# Patient Record
Sex: Female | Born: 1975 | Race: Black or African American | Hispanic: No | Marital: Single | Smoking: Current some day smoker
Health system: Southern US, Community
[De-identification: ages and names within clinical notes are randomized; demographics above are authoritative.]

## PROBLEM LIST (undated history)

## (undated) HISTORY — PX: DILATION AND CURETTAGE, DIAGNOSTIC / THERAPEUTIC: SUR384

## (undated) HISTORY — PX: CHOLECYSTECTOMY: SHX55

## (undated) HISTORY — PX: BREAST LUMPECTOMY: SHX2

## (undated) HISTORY — DX: Morbid (severe) obesity due to excess calories: E66.01

---

## 1998-09-13 ENCOUNTER — Inpatient Hospital Stay (HOSPITAL_COMMUNITY): Admission: AD | Admit: 1998-09-13 | Discharge: 1998-09-13 | Payer: Self-pay | Admitting: Obstetrics & Gynecology

## 2014-11-28 DIAGNOSIS — N939 Abnormal uterine and vaginal bleeding, unspecified: Secondary | ICD-10-CM

## 2014-11-28 HISTORY — DX: Abnormal uterine and vaginal bleeding, unspecified: N93.9

## 2017-09-06 DIAGNOSIS — N6312 Unspecified lump in the right breast, upper inner quadrant: Secondary | ICD-10-CM

## 2017-09-06 HISTORY — DX: Unspecified lump in the right breast, upper inner quadrant: N63.12

## 2019-01-03 ENCOUNTER — Other Ambulatory Visit: Payer: Self-pay | Admitting: *Deleted

## 2019-01-03 DIAGNOSIS — Z20822 Contact with and (suspected) exposure to covid-19: Secondary | ICD-10-CM

## 2019-01-08 LAB — NOVEL CORONAVIRUS, NAA: SARS-CoV-2, NAA: NOT DETECTED

## 2019-10-02 ENCOUNTER — Ambulatory Visit (HOSPITAL_COMMUNITY)
Admission: EM | Admit: 2019-10-02 | Discharge: 2019-10-02 | Disposition: A | Discharge status: Home / Self Care | Payer: Medicaid Other | Attending: Urgent Care | Admitting: Urgent Care

## 2019-10-02 ENCOUNTER — Encounter (HOSPITAL_COMMUNITY): Payer: Self-pay

## 2019-10-02 ENCOUNTER — Other Ambulatory Visit: Payer: Self-pay

## 2019-10-02 DIAGNOSIS — B349 Viral infection, unspecified: Secondary | ICD-10-CM | POA: Diagnosis present

## 2019-10-02 DIAGNOSIS — R509 Fever, unspecified: Secondary | ICD-10-CM | POA: Diagnosis present

## 2019-10-02 DIAGNOSIS — J029 Acute pharyngitis, unspecified: Secondary | ICD-10-CM | POA: Diagnosis present

## 2019-10-02 LAB — POCT RAPID STREP A: Streptococcus, Group A Screen (Direct): NEGATIVE

## 2019-10-02 MED ORDER — NAPROXEN 125 MG/5ML PO SUSP: 250.0000 mg | Freq: Three times a day (TID) | ORAL | 0 refills | Status: DC

## 2019-10-02 NOTE — Discharge Instructions (Signed)
For sore throat try using a honey-based tea. Use 3 teaspoons of honey with juice squeezed from half lemon. Place shaved pieces of ginger into 1/2-1 cup of water and warm over stove top. Then mix the ingredients and repeat every 4 hours as needed.  You may take 500mg -650mg  Tylenol with liquid naproxen every 6 hours for aches, pains, fevers and general inflammation.

## 2019-10-02 NOTE — ED Provider Notes (Signed)
[place]   MRN: [id number] DOB: 1975/09/13  Subjective:   Desiree Butler is a 44 y.o. female presenting for 3-day history acute onset persistent throat pain. Had fever last night, temp was 100.20F.  Had Covid vaccination, second dose is next week.  Very little concern for COVID-19 per patient.  No current facility-administered medications for this encounter. No current outpatient medications on file.   No Known Allergies  History reviewed. No pertinent past medical history.   Past Surgical History:  Procedure Laterality Date  . BREAST LUMPECTOMY Right   . CHOLECYSTECTOMY    . DILATION AND CURETTAGE, DIAGNOSTIC / THERAPEUTIC      No family history on file.  Social History   Tobacco Use  . Smoking status: Former Research scientist (life sciences)  . Smokeless tobacco: Never Used  Substance Use Topics  . Alcohol use: Yes    Comment: occasionally  . Drug use: Not on file    Review of Systems  Constitutional: Negative for fever and malaise/fatigue.  HENT: Positive for sore throat. Negative for congestion, ear pain and sinus pain.   Eyes: Negative for discharge and redness.  Respiratory: Negative for cough, hemoptysis, shortness of breath and wheezing.   Cardiovascular: Negative for chest pain.  Gastrointestinal: Negative for abdominal pain, diarrhea, nausea and vomiting.  Genitourinary: Negative for dysuria, flank pain and hematuria.  Musculoskeletal: Negative for myalgias.  Skin: Negative for rash.  Neurological: Negative for dizziness, weakness and headaches.  Psychiatric/Behavioral: Negative for depression and substance abuse.     Objective:   Vitals: BP 134/72 (BP Location: Left Arm)   Pulse (!) 106   Temp 98.6 F (37 C) (Oral)   Resp 18   LMP 09/30/2019 (Exact Date)   SpO2 100%   Physical Exam Constitutional:      General: She is not in acute distress.    Appearance: Normal appearance. She is well-developed. She is not ill-appearing.  HENT:     Head: Normocephalic  and atraumatic.     Nose: Nose normal.     Mouth/Throat:     Mouth: Mucous membranes are moist. No oral lesions.     Pharynx: Pharyngeal swelling (Appears to have chronic tonsillar hypertrophy bilaterally) present. No oropharyngeal exudate, posterior oropharyngeal erythema or uvula swelling.     Tonsils: No tonsillar exudate or tonsillar abscesses.  Eyes:     General: No scleral icterus.    Extraocular Movements: Extraocular movements intact.     Pupils: Pupils are equal, round, and reactive to light.  Cardiovascular:     Rate and Rhythm: Normal rate.  Pulmonary:     Effort: Pulmonary effort is normal.  Skin:    General: Skin is warm and dry.  Neurological:     General: No focal deficit present.     Mental Status: She is alert and oriented to person, place, and time.  Psychiatric:        Mood and Affect: Mood normal.        Behavior: Behavior normal.     Results for orders placed or performed during the hospital encounter of 10/02/19 (from the past 24 hour(s))  POCT rapid strep A [facility])     Status: None   Collection Time: 10/02/19  7:45 PM  Result Value Ref Range   Streptococcus, Group A Screen (Direct) NEGATIVE NEGATIVE    Assessment and Plan :   1. Viral syndrome   2. Sore throat   3. Fever, unspecified     Strep culture pending, use liquid  naproxen and Tylenol for supportive care.  General supportive care information provided to the patient.  She refused COVID-19 testing today.  Will consider this if no improvement. Counseled patient on potential for adverse effects with medications prescribed/recommended today, ER and return-to-clinic precautions discussed, patient verbalized understanding.    Jaynee Eagles, PA-C 10/02/19 2004

## 2019-10-02 NOTE — ED Triage Notes (Signed)
C/o sore throat x 2 days.

## 2019-10-05 LAB — CULTURE, GROUP A STREP (THRC)

## 2019-10-23 ENCOUNTER — Encounter (HOSPITAL_COMMUNITY): Payer: Self-pay | Admitting: Emergency Medicine

## 2019-10-23 ENCOUNTER — Emergency Department (HOSPITAL_COMMUNITY)
Admission: EM | Admit: 2019-10-23 | Discharge: 2019-10-24 | Disposition: A | Discharge status: Home / Self Care | Payer: Medicaid Other | Attending: Emergency Medicine | Admitting: Emergency Medicine

## 2019-10-23 ENCOUNTER — Other Ambulatory Visit: Payer: Self-pay

## 2019-10-23 DIAGNOSIS — Z5321 Procedure and treatment not carried out due to patient leaving prior to being seen by health care provider: Secondary | ICD-10-CM | POA: Diagnosis not present

## 2019-10-23 DIAGNOSIS — R109 Unspecified abdominal pain: Secondary | ICD-10-CM | POA: Insufficient documentation

## 2019-10-23 LAB — COMPREHENSIVE METABOLIC PANEL
ALT: 21 U/L (ref 0–44)
AST: 21 U/L (ref 15–41)
Albumin: 3.9 g/dL (ref 3.5–5.0)
Alkaline Phosphatase: 43 U/L (ref 38–126)
Anion gap: 8 (ref 5–15)
BUN: 14 mg/dL (ref 6–20)
CO2: 23 mmol/L (ref 22–32)
Calcium: 8.8 mg/dL — ABNORMAL LOW (ref 8.9–10.3)
Chloride: 109 mmol/L (ref 98–111)
Creatinine, Ser: 0.9 mg/dL (ref 0.44–1.00)
GFR calc Af Amer: >60 mL/min (ref >60)
GFR calc non Af Amer: >60 mL/min (ref >60)
Glucose, Bld: 102 mg/dL — ABNORMAL HIGH (ref 70–99)
Potassium: 3.7 mmol/L (ref 3.5–5.1)
Sodium: 140 mmol/L (ref 135–145)
Total Bilirubin: 0.8 mg/dL (ref 0.3–1.2)
Total Protein: 6.7 g/dL (ref 6.5–8.1)

## 2019-10-23 LAB — CBC
HCT: 38.3 % (ref 36.0–46.0)
Hemoglobin: 12.3 g/dL (ref 12.0–15.0)
MCH: 30.3 pg (ref 26.0–34.0)
MCHC: 32.1 g/dL (ref 30.0–36.0)
MCV: 94.3 fL (ref 80.0–100.0)
Platelets: 219 10*3/uL (ref 150–400)
RBC: 4.06 MIL/uL (ref 3.87–5.11)
RDW: 12.8 % (ref 11.5–15.5)
WBC: 6.3 10*3/uL (ref 4.0–10.5)
nRBC: 0 % (ref 0.0–0.2)

## 2019-10-23 LAB — I-STAT BETA HCG BLOOD, ED (MC, WL, AP ONLY): I-stat hCG, quantitative: <5 m[IU]/mL (ref <5)

## 2019-10-23 LAB — LIPASE, BLOOD: Lipase: 25 U/L (ref 11–51)

## 2019-10-23 NOTE — ED Notes (Signed)
Karaline Wiebke sister [phone] would like a call when patient is in a room

## 2019-10-23 NOTE — ED Notes (Signed)
Pt came to this tech requesting to remove her IV because she wanted to leave. Pt was encouraged to stay. Pt decided to leave. IV was removed.

## 2019-10-23 NOTE — ED Triage Notes (Signed)
Pt arrives via EMS with lower abd pain starting today. Hx of fibroids. 100 mcg fentanyl given by EMS.

## 2019-10-24 ENCOUNTER — Encounter (HOSPITAL_COMMUNITY): Payer: Self-pay

## 2019-10-24 ENCOUNTER — Other Ambulatory Visit: Payer: Self-pay

## 2019-10-24 ENCOUNTER — Ambulatory Visit (HOSPITAL_COMMUNITY)
Admission: EM | Admit: 2019-10-24 | Discharge: 2019-10-24 | Disposition: A | Discharge status: Home / Self Care | Payer: Medicaid Other | Attending: Family Medicine | Admitting: Family Medicine

## 2019-10-24 DIAGNOSIS — R1030 Lower abdominal pain, unspecified: Secondary | ICD-10-CM

## 2019-10-24 MED ORDER — DICLOFENAC SODIUM 75 MG PO TBEC
75.0000 mg | DELAYED_RELEASE_TABLET | Freq: Two times a day (BID) | ORAL | 0 refills | Status: DC
Start: 2019-10-24 — End: 2019-12-13

## 2019-10-24 NOTE — ED Triage Notes (Signed)
Pt is here with abdominal paint hat started yesterday, pt has not taken any meds to relieve discomfort. States she went to the ED and then left.

## 2019-10-24 NOTE — Discharge Instructions (Signed)

## 2019-10-24 NOTE — ED Provider Notes (Signed)
[place]   [id number] 10/24/19 Arrival Time: [id number]  ASSESSMENT & PLAN:  1. Lower abdominal pain     Overall benign abdominal exam. No indications for urgent abdominal/pelvic imaging at this time. Discussed. Labs reviewed from ED yesterday.  Recommend: Follow-up Information    Schedule an appointment as soon as possible for a visit  with [facility].   Specialty: Obstetrics and Gynecology Contact information: [address], [place] [id number] mc [place] [phone] [place].   Specialty: Emergency Medicine Why: If symptoms worsen in any way. Contact information: [address] [id number] [place] [phone]          Begin: Meds ordered this encounter  Medications  . diclofenac (VOLTAREN) 75 MG EC tablet    Sig: Take 1 tablet (75 mg total) by mouth 2 (two) times daily.    Dispense:  14 tablet    Refill:  0     Discharge Instructions     You have been seen today for abdominal pain. Your evaluation was not suggestive of any emergent condition requiring medical intervention at this time. However, some abdominal problems make take more time to appear. Therefore, it is very important for you to pay attention to any new symptoms or worsening of your current condition.  Please return here or to the Emergency Department immediately should you begin to feel worse in any way or have any of the following symptoms: increasing or different abdominal pain, persistent vomiting, inability to drink fluids, fevers, or shaking chills.        Reviewed expectations re: course of current medical issues. Questions answered. Outlined signs and symptoms indicating need for more acute intervention. Patient verbalized understanding. After Visit Summary given.   SUBJECTIVE: History from: patient. Desiree Butler is a 44 y.o. female who presents with complaint of fairly persistent midline lower abdominal pain. Onset gradual, yesterday. Went to ED and [facility]BS secondary to wait time. Labs done there. Discomfort described as fullness/ache; without radiation; does not wake her at night. Reports normal flatus. Symptoms are unchanged since beginning. Fever: absent. Aggravating factors: have not been identified. Alleviating factors: have not been identified. Associated symptoms: mild nausea yesterday without emesis; none today. She denies arthralgias, constipation, diarrhea, fever and myalgias. Appetite: normal. PO intake: normal. Ambulatory without assistance. Urinary symptoms: none. Bowel movements: have not significantly changed. History of similar: yes (reports U/S showing fibroids approx 3 mo ago)). OTC treatment: none reported.  Patient's last menstrual period was 09/30/2019 (exact date).   Past Surgical History:  Procedure Laterality Date  . BREAST LUMPECTOMY Right   . CHOLECYSTECTOMY    . DILATION AND CURETTAGE, DIAGNOSTIC / THERAPEUTIC       OBJECTIVE:  Vitals:   10/24/19 1231  BP: (!) 145/84  Pulse: 79  Resp: 19  Temp: 98.2 F (36.8 C)  TempSrc: Oral  SpO2: 100%    General appearance: alert, oriented, no acute distress HEENT: [place]; AT Lungs: unlabored respirations Abdomen: soft; without distention; mild  and poorly localized tenderness to palpation over midline lower abdomen; without masses or organomegaly; without guarding or rebound tenderness Back: without reported CVA tenderness; FROM at waist Extremities: without LE edema; symmetrical; without gross deformities Skin: warm and dry Neurologic: normal gait Psychological: alert and cooperative; normal mood and affect  Labs Reviewed: Results for orders placed or performed during the hospital encounter of 10/23/19  Lipase,  blood  Result Value Ref Range   Lipase 25 11 - 51 U/L  Comprehensive metabolic panel  Result Value Ref Range    Sodium 140 135 - 145 mmol/L   Potassium 3.7 3.5 - 5.1 mmol/L   Chloride 109 98 - 111 mmol/L   CO2 23 22 - 32 mmol/L   Glucose, Bld 102 (H) 70 - 99 mg/dL   BUN 14 6 - 20 mg/dL   Creatinine, Ser 0.90 0.44 - 1.00 mg/dL   Calcium 8.8 (L) 8.9 - 10.3 mg/dL   Total Protein 6.7 6.5 - 8.1 g/dL   Albumin 3.9 3.5 - 5.0 g/dL   AST 21 15 - 41 U/L   ALT 21 0 - 44 U/L   Alkaline Phosphatase 43 38 - 126 U/L   Total Bilirubin 0.8 0.3 - 1.2 mg/dL   GFR calc non Af Amer >60 >60 mL/min   GFR calc Af Amer >60 >60 mL/min   Anion gap 8 5 - 15  CBC  Result Value Ref Range   WBC 6.3 4.0 - 10.5 K/uL   RBC 4.06 3.87 - 5.11 MIL/uL   Hemoglobin 12.3 12.0 - 15.0 g/dL   HCT 38.3 36.0 - 46.0 %   MCV 94.3 80.0 - 100.0 fL   MCH 30.3 26.0 - 34.0 pg   MCHC 32.1 30.0 - 36.0 g/dL   RDW 12.8 11.5 - 15.5 %   Platelets 219 150 - 400 K/uL   nRBC 0.0 0.0 - 0.2 %  I-Stat beta hCG blood, ED  Result Value Ref Range   I-stat hCG, quantitative <5.0 <5 mIU/mL   Comment 3             No Known Allergies                                             History reviewed. No pertinent past medical history.  Social History   Socioeconomic History  . Marital status: Single    Spouse name: Not on file  . Number of children: Not on file  . Years of education: Not on file  . Highest education level: Not on file  Occupational History  . Not on file  Tobacco Use  . Smoking status: Current Every Day Smoker    Types: Cigars  . Smokeless tobacco: Never Used  Substance and Sexual Activity  . Alcohol use: Yes    Comment: occasionally  . Drug use: Yes    Types: Marijuana  . Sexual activity: Yes    Birth control/protection: Pill  Other Topics Concern  . Not on file  Social History Narrative  . Not on file   Social Determinants of Health   Financial Resource Strain:   . Difficulty of Paying Living Expenses:   Food Insecurity:   . Worried About Charity fundraiser in the Last Year:   . Arboriculturist in the Last  Year:   Transportation Needs:   . Film/video editor (Medical):   Marland Kitchen Lack of Transportation (Non-Medical):   Physical Activity:   . Days of Exercise per Week:   . Minutes of Exercise per Session:   Stress:   . Feeling of Stress :   Social Connections:   . Frequency of Communication with Friends and Family:   . Frequency of Social Gatherings with Friends and Family:   . Attends Religious  Services:   . Active Member of Clubs or Organizations:   . Attends Archivist Meetings:   Marland Kitchen Marital Status:   Intimate Partner Violence:   . Fear of Current or Ex-Partner:   . Emotionally Abused:   Marland Kitchen Physically Abused:   . Sexually Abused:     Family History  Problem Relation Age of Onset  . Breast cancer Mother   . Thyroid nodules Mother   . Hyperlipidemia Father   . Hypertension Father      Vanessa Kick, MD 10/24/19 1332

## 2019-12-13 ENCOUNTER — Other Ambulatory Visit: Payer: Self-pay

## 2019-12-13 ENCOUNTER — Ambulatory Visit (INDEPENDENT_AMBULATORY_CARE_PROVIDER_SITE_OTHER): Payer: Medicaid Other | Admitting: Obstetrics & Gynecology

## 2019-12-13 ENCOUNTER — Encounter: Payer: Self-pay | Admitting: Obstetrics & Gynecology

## 2019-12-13 VITALS — BP 120/87 | HR 75 | Wt 251.4 lb

## 2019-12-13 DIAGNOSIS — Z8742 Personal history of other diseases of the female genital tract: Secondary | ICD-10-CM | POA: Diagnosis not present

## 2019-12-13 DIAGNOSIS — Z3202 Encounter for pregnancy test, result negative: Secondary | ICD-10-CM

## 2019-12-13 DIAGNOSIS — N939 Abnormal uterine and vaginal bleeding, unspecified: Secondary | ICD-10-CM | POA: Diagnosis not present

## 2019-12-13 DIAGNOSIS — Z1231 Encounter for screening mammogram for malignant neoplasm of breast: Secondary | ICD-10-CM

## 2019-12-13 DIAGNOSIS — D219 Benign neoplasm of connective and other soft tissue, unspecified: Secondary | ICD-10-CM

## 2019-12-13 HISTORY — DX: Personal history of other diseases of the female genital tract: Z87.42

## 2019-12-13 HISTORY — DX: Benign neoplasm of connective and other soft tissue, unspecified: D21.9

## 2019-12-13 LAB — POCT PREGNANCY, URINE: Preg Test, Ur: NEGATIVE

## 2019-12-13 MED ORDER — NORETHINDRONE ACETATE 5 MG PO TABS: 10.0000 mg | ORAL_TABLET | Freq: Every day | ORAL | 2 refills | Status: DC

## 2019-12-13 NOTE — Progress Notes (Signed)
GYNECOLOGY OFFICE VISIT NOTE  History:   Desiree Butler is a 44 y.o. [phone] here today for management of known fibroids and recurrent physiologic ovarian cysts.  Has been followed by GYN at [facility] with serial scans, showed increasing ovarian cyst size but still physiologic.  Negative tumor markers. Fibroids also noted, largest about 6.4 cm in size.  See report summaries below: Ultrasound Summaries from [facility] (via [facility]) 05/22/2019 Left 9.0 x 6.1 x 5.3, and right 9.4 x 8.6 x 5.4 cm with largest cysts on the right measuring 4.1 cm and on the left 5.2 cm, all cystic.  02/26/2019 Ultrasound report shows: 1. Fibroid - Largest 6.4 x 5.8 x6.4 cm 2. Right ovary - Largest simple cyst 4.95 x 3.9 x 3.8 cm 3 .Left ovary - Largest simple cyst 4.4 x 4.0 x 4.0 cm Patient has been treated with OCPs, on Loestrin 1/20. Does not take it every day as instructed.  Also has AUB, worsened by OCP non-compliance.   Of note, she has a history of complex endometrial hyperplasia with atypia on D&C, no further pathology reports since then noted. Did have progestin IUD in place for years, was removed late last year due to pain.  LMP 12/11/19. Last period before was April 2020, lasted for 7 days; for 12 days after patient reported spotting.  Has not taken OCPs the past month. Reports unprotected intercourse in the past 2 weeks.   She denies any current abnormal vaginal discharge, bleeding, pelvic pain or other concerns.    Past Medical History:  Diagnosis Date   Abnormal uterine bleeding (AUB) 11/28/2014   Breast lump on right side at 2 o'clock position 09/06/2017   Formatting of this note might be different from the original. 10/2014 diagnositc mammogram was benign but recommended breast MRI if symptoms persist.   Fibroids 12/13/2019   History of complex endometrial hyperplasia without atypia 12/13/2019   On D&C in 2017   Morbid obesity ([place])     Past Surgical History:  Procedure  Laterality Date   BREAST LUMPECTOMY Right    CHOLECYSTECTOMY     DILATION AND CURETTAGE, DIAGNOSTIC / THERAPEUTIC      The following portions of the patient's history were reviewed and updated as appropriate: allergies, current medications, past family history, past medical history, past social history, past surgical history and problem list.   Health Maintenance:  Normal pap in 2016 (order seen in [place] but no report, patient said it was normal. No subsequent ones seen)/  Review of Systems:  Pertinent items noted in HPI and remainder of comprehensive ROS otherwise negative.  Physical Exam:  BP 120/87    Pulse 75    Wt 251 lb 6.4 oz (114 kg)    LMP 12/11/2019 (Exact Date)    BMI 41.84 kg/m  CONSTITUTIONAL: Well-developed, well-nourished female in no acute distress.  HEENT:  Normocephalic, atraumatic. External right and left ear normal. No scleral icterus.  NECK: Normal range of motion, supple, no masses noted on observation SKIN: No rash noted. Not diaphoretic. No erythema. No pallor. MUSCULOSKELETAL: Normal range of motion. No edema noted. NEUROLOGIC: Alert and oriented to person, place, and time. Normal muscle tone coordination. No cranial nerve deficit noted. PSYCHIATRIC: Normal mood and affect. Normal behavior. Normal judgment and thought content. CARDIOVASCULAR: Normal heart rate noted RESPIRATORY: Effort and breath sounds normal, no problems with respiration noted ABDOMEN: No overt external masses noted. Obese. No other overt distention noted.   PELVIC: Deferred  Labs and Imaging Results  for orders placed or performed in visit on 12/13/19 (from the past 168 hour(s))  Pregnancy, urine POC   Collection Time: 12/13/19 10:16 AM  Result Value Ref Range   Preg Test, Ur NEGATIVE NEGATIVE   No results found.    Assessment and Plan:      1. Abnormal uterine bleeding (AUB) 2. Fibroids 3. History of complex endometrial hyperplasia without atypia Discussed management  options for her symptoms oral progesterone (avoid estrogen due to morbid obesity), Elagolix therapy, re-trial of Levonogestrel IUD or hysterectomy as definitive surgical management.  Discussed details of each method.   Patient desires medical management for now.  Ultrasound ordered, may need endometrial biopsy. Bleeding precautions reviewed.  Will reevaluate when she returns in 3-4 weeks.   - US PELVIC COMPLETE WITH TRANSVAGINAL; Future - norethindrone (AYGESTIN) 5 MG tablet; Take 2 tablets (10 mg total) by mouth daily.  Dispense: 60 tablet; Refill: 2  4. Breast cancer screening by mammogram Mammogram scheduled - MM 3D SCREEN BREAST BILATERAL; Future Routine preventative health maintenance measures emphasized, pap smear to be done next visit. Please refer to After Visit Summary for other counseling recommendations.   Return in about 4 weeks (around 01/10/2020) for Pap smear and follow up fibroids, AUB, ovarian cysts.    Total face-to-face time with patient: 30 minutes.  Over 50% of encounter was spent on counseling and coordination of care.   Verita Schneiders, MD, [place] for [employer], [place]

## 2019-12-13 NOTE — Patient Instructions (Addendum)
[place] [address] [place], [place] [address] [phone]   or  [email or web address] **SNAP/EBT/ Other nutritional benefits  [facility] [address], [place], [place] [address] [phone]  or  https://palmer-[email or web address] **WIC for  women who are pregnant and postpartum, infants and children up to 42 years old  [place] [address], [place], [place] [address] [phone]   or   www.theblessedtable.org  **Food pantry  [facility] [address], [place], [place] [address] [phone]   or   https://brotherkolbes.[email or web address]  **Emergency food and prepared meals  [place] [address], [place], [place] [address] [phone]   or   www.[email or web address].us **Food pantry  [facility] [address] [place], [place] [address] [phone]   or   [email or web address]://[email or web address]-[email or web address] **Food pantry  [employer] [facility] [address] [place], [place] [address] [phone] **Food pantry  [facility] [address], [place], [place] [address] [phone]   or   www.greensborourbanministry.org  Insurance underwriter and prepared meals  [facility] [address] [place], [place], [place], [place] [address] [email or web address]  **Food pantry  [employer] [facility] [address] [place], [place] [address] [phone]   or   www.lbcnow.org  **Food pantry  [facility] [address], [place], [place] [address] [phone]   or   http://patterson-parker.[email or web address] **Food pantry, nutrition education, gardening activities  [place] [address] [place], [place] [address] [phone] **Food pantry  [facility] [address], [place], [place] [address] [phone]   or   www.salvationarmyof[place]boro.[place] of [place], [place], [place] [address] [phone]   or   http://senior-resources-guilford.org [facility] [place] [address] [place], [place] [address] [phone]   or   www.[email or web address].[email or web address]  **Food pantry  [place] [address], [place], [place] [address] [phone]   or   vandaliapresbyterianchurch.org **Food pantry  Thank you for enrolling in [place]. Please follow the instructions below to securely access your online medical record. MyChart allows you to send messages to your doctor, view your test results, manage appointments, and more.   How Do I Sign Up? 1. In your Internet browser, go to [employer] and enter [email or web address]. 2. Click on the Sign Up Now link in the Sign In box. You will see the New Member Sign Up page. 3. Enter your MyChart Access Code exactly as it appears below. You will not need to use this code after you've completed the sign-up process. If you do not sign up before the expiration date, you must request a new code.  MyChart Access Code: [email or web address] Expires: 01/27/2020  9:41 AM  4. Enter your Social Security Number ([id number]) and Date of Birth (mm/dd/yyyy) as indicated and click Submit. You will be taken to the next sign-up page. 5. Create a MyChart ID. This will be your MyChart login ID and cannot be changed, so think of one that is secure and easy to remember. 6. Create a MyChart password. You can change your password at any time. 7. Enter your Password Reset Question and Answer. This can be used at a later time if you forget your password.  8. Enter your e-mail address. You will receive  e-mail notification when new information is  available in [place]. 9. Click Sign Up. You can now view your medical record.   Additional Information Remember, MyChart is NOT to be used for urgent needs. For medical emergencies, dial 911.

## 2019-12-25 ENCOUNTER — Ambulatory Visit
Admission: RE | Admit: 2019-12-25 | Discharge: 2019-12-25 | Disposition: A | Discharge status: Home / Self Care | Payer: Medicaid Other | Source: Ambulatory Visit | Attending: Obstetrics & Gynecology | Admitting: Obstetrics & Gynecology

## 2019-12-25 ENCOUNTER — Other Ambulatory Visit: Payer: Self-pay

## 2019-12-25 DIAGNOSIS — Z1231 Encounter for screening mammogram for malignant neoplasm of breast: Secondary | ICD-10-CM

## 2020-01-11 ENCOUNTER — Ambulatory Visit
Admission: RE | Admit: 2020-01-11 | Discharge: 2020-01-11 | Disposition: A | Discharge status: Home / Self Care | Payer: Medicaid Other | Source: Ambulatory Visit | Attending: Obstetrics & Gynecology | Admitting: Obstetrics & Gynecology

## 2020-01-11 ENCOUNTER — Other Ambulatory Visit: Payer: Self-pay

## 2020-01-11 DIAGNOSIS — N939 Abnormal uterine and vaginal bleeding, unspecified: Secondary | ICD-10-CM | POA: Diagnosis present

## 2020-01-11 DIAGNOSIS — D219 Benign neoplasm of connective and other soft tissue, unspecified: Secondary | ICD-10-CM

## 2020-01-11 DIAGNOSIS — Z8742 Personal history of other diseases of the female genital tract: Secondary | ICD-10-CM

## 2020-01-18 ENCOUNTER — Other Ambulatory Visit: Payer: Self-pay

## 2020-01-18 ENCOUNTER — Encounter: Payer: Self-pay | Admitting: Family Medicine

## 2020-01-18 ENCOUNTER — Encounter: Payer: Self-pay | Admitting: Obstetrics & Gynecology

## 2020-01-18 ENCOUNTER — Other Ambulatory Visit (HOSPITAL_COMMUNITY)
Admission: RE | Admit: 2020-01-18 | Discharge: 2020-01-18 | Disposition: A | Discharge status: Home / Self Care | Payer: Medicaid Other | Source: Ambulatory Visit | Attending: Obstetrics & Gynecology | Admitting: Obstetrics & Gynecology

## 2020-01-18 ENCOUNTER — Ambulatory Visit (INDEPENDENT_AMBULATORY_CARE_PROVIDER_SITE_OTHER): Payer: Medicaid Other | Admitting: Obstetrics & Gynecology

## 2020-01-18 VITALS — BP 118/84 | HR 73 | Wt 245.7 lb

## 2020-01-18 DIAGNOSIS — R109 Unspecified abdominal pain: Secondary | ICD-10-CM | POA: Diagnosis not present

## 2020-01-18 DIAGNOSIS — N939 Abnormal uterine and vaginal bleeding, unspecified: Secondary | ICD-10-CM | POA: Diagnosis not present

## 2020-01-18 DIAGNOSIS — Z124 Encounter for screening for malignant neoplasm of cervix: Secondary | ICD-10-CM | POA: Insufficient documentation

## 2020-01-18 DIAGNOSIS — Z8742 Personal history of other diseases of the female genital tract: Secondary | ICD-10-CM | POA: Insufficient documentation

## 2020-01-18 DIAGNOSIS — Z3202 Encounter for pregnancy test, result negative: Secondary | ICD-10-CM

## 2020-01-18 LAB — POCT PREGNANCY, URINE: Preg Test, Ur: NEGATIVE

## 2020-01-18 MED ORDER — IBUPROFEN 600 MG PO TABS: 600.0000 mg | ORAL_TABLET | Freq: Once | ORAL | Status: DC

## 2020-01-18 MED ORDER — IBUPROFEN 800 MG PO TABS
800.0000 mg | ORAL_TABLET | Freq: Once | ORAL | Status: AC
  Administered 2020-01-18: 800 mg via ORAL

## 2020-01-18 NOTE — Patient Instructions (Signed)

## 2020-01-18 NOTE — Progress Notes (Addendum)
GYNECOLOGY OFFICE VISIT NOTE  History:   Desiree Butler is a 44 y.o. [phone] here today for follow up ultrasound results, and for pap and endometrial biopsy. Has AUB, fibroids and history of complex endometrial hyperplasia. Started on Norethindrone last visit, no bleeding since then. Reports some abdominal cramping, but denies any abnormal vaginal discharge, bleeding or other concerns.    Past Medical History:  Diagnosis Date  . Abnormal uterine bleeding (AUB) 11/28/2014  . Breast lump on right side at 2 o'clock position 09/06/2017   Formatting of this note might be different from the original. 10/2014 diagnositc mammogram was benign but recommended breast MRI if symptoms persist.  . Fibroids 12/13/2019  . History of complex endometrial hyperplasia without atypia 12/13/2019   On D&C in 2017  . Morbid obesity ([place])     Past Surgical History:  Procedure Laterality Date  . BREAST LUMPECTOMY Right   . CHOLECYSTECTOMY    . DILATION AND CURETTAGE, DIAGNOSTIC / THERAPEUTIC      The following portions of the patient's history were reviewed and updated as appropriate: allergies, current medications, past family history, past medical history, past social history, past surgical history and problem list.   Health Maintenance:  Normal pap in 2016.  Normal mammogram on 12/25/2019.   Review of Systems:  Pertinent items noted in HPI and remainder of comprehensive ROS otherwise negative.  Physical Exam:  BP 118/84   Pulse 73   Wt (!) 245 lb 11.2 oz (111.4 kg)   LMP 12/11/2019 (Exact Date) Comment: AUB; currently taking Aygestin  BMI 40.89 kg/m  CONSTITUTIONAL: Well-developed, well-nourished female in no acute distress.  HEENT:  Normocephalic, atraumatic. External right and left ear normal. No scleral icterus.  NECK: Normal range of motion, supple, no masses noted on observation SKIN: No rash noted. Not diaphoretic. No erythema. No pallor. MUSCULOSKELETAL: Normal range of motion. No edema  noted. NEUROLOGIC: Alert and oriented to person, place, and time. Normal muscle tone coordination. No cranial nerve deficit noted. PSYCHIATRIC: Normal mood and affect. Normal behavior. Normal judgment and thought content. CARDIOVASCULAR: Normal heart rate noted RESPIRATORY: Effort and breath sounds normal, no problems with respiration noted ABDOMEN: No masses noted. No other overt distention noted.  Obese. Fibroid uterus palpated. PELVIC: Normal appearing external genitalia; normal urethral meatus; normal appearing vaginal mucosa and cervix.  No abnormal discharge noted. Pap smear obtained. Performed in the presence of a chaperone  ENDOMETRIAL BIOPSY     The indications for endometrial biopsy were reviewed.   Risks of the biopsy including cramping, bleeding, infection, uterine perforation, inadequate specimen and need for additional procedures  were discussed. The patient states she understands and agrees to undergo procedure today. Consent was signed. Time out was performed. Urine HCG was negative. During the pelvic exam, the cervix was prepped with Betadine. A single-toothed tenaculum was placed on the anterior lip of the cervix to stabilize it. The 3 mm pipelle was introduced into the endometrial cavity without difficulty to a depth of 10 cm, and a moderate amount of tissue was obtained and sent to pathology. The instruments were removed from the patient's vagina. Minimal bleeding from the cervix was noted. The patient tolerated the procedure well. Routine post-procedure instructions were given to the patient.    Labs and Imaging Results for orders placed or performed in visit on 01/18/20 (from the past 168 hour(s))  Pregnancy, urine POC   Collection Time: 01/18/20  9:40 AM  Result Value Ref Range   Preg Test, Ur NEGATIVE  NEGATIVE   MM 3D SCREEN BREAST BILATERAL  Result Date: 12/27/2019 CLINICAL DATA:  Screening. EXAM: DIGITAL SCREENING BILATERAL MAMMOGRAM WITH TOMO AND CAD COMPARISON:  None.  ACR Breast Density Category b: There are scattered areas of fibroglandular density. FINDINGS: There are no findings suspicious for malignancy. Images were processed with CAD. IMPRESSION: No mammographic evidence of malignancy. A result letter of this screening mammogram will be mailed directly to the patient. RECOMMENDATION: Screening mammogram in one year. (Code:SM-B-01Y) BI-RADS CATEGORY  1: Negative. Electronically Signed   By: Nolon Nations M.D.   On: 12/27/2019 13:29   US PELVIC COMPLETE WITH TRANSVAGINAL  Result Date: 01/12/2020 CLINICAL DATA:  Initial evaluation for history of uterine fibroids, ovarian cyst. Abnormal uterine bleeding. EXAM: TRANSABDOMINAL AND TRANSVAGINAL ULTRASOUND OF PELVIS TECHNIQUE: Both transabdominal and transvaginal ultrasound examinations of the pelvis were performed. Transabdominal technique was performed for global imaging of the pelvis including uterus, ovaries, adnexal regions, and pelvic cul-de-sac. It was necessary to proceed with endovaginal exam following the transabdominal exam to visualize the uterus, endometrium, and ovaries. COMPARISON:  None FINDINGS: Uterus Measurements: 10.3 x 8.1 x 8.0 cm. Multiple uterine fibroids are seen, 2 largest of which are measured. 5.8 x 4.3 x 7.4 cm subserosal fibroid present at the posterior uterine fundus. Additional 8.7 x 6.4 x 7.0 cm subserosal to exophytic fibroid present at the right anterior uterine fundus. Endometrium Thickness: 3.5 mm.  No focal abnormality visualized. Right ovary Measurements: 8.9 x 4.9 x 7.7 cm = volume: 177.2 mL. 4.1 x 3.7 x 3.2 cm simple cyst noted. Additional 3.0 x 2.8 x 3.0 cm simple cyst present. No significant internal complexity, vascularity, or solid component. Left ovary Measurements: 8.3 x 5.2 x 6.4 cm = volume: 144 mL. 4.4 x 3.8 x 3.3 cm simple cyst. No internal complexity, vascularity, or solid component. Other findings Trace free fluid noted within the pelvis. IMPRESSION: 1. Enlarged fibroid  uterus as detailed above. 2. Multiple simple bilateral ovarian cysts as above, largest of which measures 4.4 cm on the left. These have benign characteristics and are a common finding in premenopausal females. No imaging follow up is required. This follows consensus guidelines: Simple Adnexal Cysts: SRU Consensus Conference Update on Follow-up and Reporting. Radiology 2019; [id number]. 3. Endometrial stripe measures 3.5 mm in thickness. If bleeding remains unresponsive to hormonal or medical therapy, sonohysterogram should be considered for focal lesion work-up. (Ref: Radiological Reasoning: Algorithmic Workup of Abnormal Vaginal Bleeding with Endovaginal Sonography and Sonohysterography. AJR 2008; 259:[id number]) Electronically Signed   By: Jeannine Boga M.D.   On: 01/12/2020 03:47      Assessment and Plan:      1. Encounter for Papanicolaou smear for cervical cancer screening 2. History of complex endometrial hyperplasia without atypia 3. Abnormal uterine bleeding (AUB) - Cytology - PAP - Surgical pathology Will follow up results of pap and endometrial biopsy and manage accordingly.  Ultrasound results reviewed with patient.  Not concerned about bilateral physiologic appearing cysts but torsion precautions emphasized. No intervention needed for them.   Continue Norethindrone for now. Bleeding precautions reviewed.  4. Abdominal cramps Ibuprofen prescribed. - ibuprofen (ADVIL) tablet 600 mg  Routine preventative health maintenance measures emphasized. Please refer to After Visit Summary for other counseling recommendations.   Return for any gynecologic concerns.    Total face-to-face time with patient: 20 minutes.  Over 50% of encounter was spent on counseling and coordination of care.   Verita Schneiders, MD, [place] [facility], [facility]

## 2020-01-21 LAB — CYTOLOGY - PAP
Chlamydia: NEGATIVE
Comment: NEGATIVE
Comment: NEGATIVE
Comment: NEGATIVE
Comment: NORMAL
Diagnosis: NEGATIVE
High risk HPV: NEGATIVE
Neisseria Gonorrhea: NEGATIVE
Trichomonas: NEGATIVE

## 2020-01-21 LAB — SURGICAL PATHOLOGY

## 2020-01-22 ENCOUNTER — Telehealth (INDEPENDENT_AMBULATORY_CARE_PROVIDER_SITE_OTHER): Payer: Medicaid Other

## 2020-01-22 DIAGNOSIS — Z712 Person consulting for explanation of examination or test findings: Secondary | ICD-10-CM

## 2020-01-22 DIAGNOSIS — N939 Abnormal uterine and vaginal bleeding, unspecified: Secondary | ICD-10-CM

## 2020-01-22 NOTE — Telephone Encounter (Addendum)
-----   Message from Osborne Oman, MD sent at 01/21/2020 12:26 PM EDT ----- Benign endometrial biopsy. Please call to inform patient of results.  Notified pt of results and per note from Anyanwu to continue taking the BCP and ibuprofen 600 mg for pain.  I also advised pt to f/u as needed.  Pt verbalized understanding with no further questions.   Mel Almond, RN  01/22/20

## 2020-02-07 ENCOUNTER — Telehealth (INDEPENDENT_AMBULATORY_CARE_PROVIDER_SITE_OTHER): Payer: Medicaid Other | Admitting: Obstetrics & Gynecology

## 2020-02-07 DIAGNOSIS — N939 Abnormal uterine and vaginal bleeding, unspecified: Secondary | ICD-10-CM

## 2020-02-07 NOTE — Telephone Encounter (Signed)
Patient state she has been bleeding for the past couple of weeks since her procedure.

## 2020-02-07 NOTE — Telephone Encounter (Addendum)
Not concerned about spotting after recent endometrial biopsy. If bleeding increases/worsens, may need more evaluation. Continue Aygestin as prescribed. Patient informed of this via MyChart.

## 2020-02-07 NOTE — Telephone Encounter (Signed)
Called patient in response to her continued vaginal bleeding.   She is wearing a liner and noting bright red, brown or pink when she wipes mainly. Discussed this is most likely due to the procedure but will reach out to Dr. Harolyn Rutherford. She is continuing with her Aygestin as prescribed.   Informed patient we will reach back out once we hear back from Dr. Harolyn Rutherford. Patient voiced understanding.

## 2020-02-20 ENCOUNTER — Other Ambulatory Visit: Payer: Self-pay | Admitting: Obstetrics and Gynecology

## 2020-02-20 MED ORDER — MEGESTROL ACETATE 40 MG PO TABS
40.0000 mg | ORAL_TABLET | Freq: Two times a day (BID) | ORAL | 5 refills | Status: DC
Start: 2020-02-20 — End: 2020-08-18

## 2020-02-25 ENCOUNTER — Ambulatory Visit (HOSPITAL_COMMUNITY)
Admission: EM | Admit: 2020-02-25 | Discharge: 2020-02-25 | Disposition: A | Discharge status: Home / Self Care | Payer: Medicaid Other | Attending: Family Medicine | Admitting: Family Medicine

## 2020-02-25 ENCOUNTER — Encounter (HOSPITAL_COMMUNITY): Payer: Self-pay | Admitting: Emergency Medicine

## 2020-02-25 ENCOUNTER — Other Ambulatory Visit: Payer: Self-pay

## 2020-02-25 DIAGNOSIS — M79601 Pain in right arm: Secondary | ICD-10-CM | POA: Diagnosis not present

## 2020-02-25 MED ORDER — METHYLPREDNISOLONE 4 MG PO TBPK
ORAL_TABLET | ORAL | 0 refills | Status: AC
Start: 2020-02-25 — End: ?

## 2020-02-25 MED ORDER — HYDROCODONE-ACETAMINOPHEN 5-325 MG PO TABS: 1.0000 | ORAL_TABLET | Freq: Four times a day (QID) | ORAL | 0 refills | Status: DC | PRN

## 2020-02-25 NOTE — ED Triage Notes (Signed)
Pt c/o right forearm pain onset this morning. Pt denies any injury. Pt states pain is shooting up her arm and into her finger. Pt states it is hard to make a fist.

## 2020-02-25 NOTE — Discharge Instructions (Signed)
You need to take the Medrol Dosepak as directed.  This is a steroid anti-inflammatory medicine.  Take all of day 1 today (3 now and 3 at bedtime) Take hydrocodone as needed for pain.  Do not drive on hydrocodone. Rest for the next day or 2. Return if you do not see improvement by the end of the week

## 2020-02-26 NOTE — ED Provider Notes (Signed)
[place]    CSN: [id number] Arrival date & time: 02/25/20  1439      History   Chief Complaint Chief Complaint  Patient presents with  . Arm Pain    HPI Desiree Butler is a 44 y.o. female.   HPI  Patient is here for right arm pain.  She had a normal day yesterday, she was not at work.  No trauma or injury, no heavy work.  This morning she woke up feeling normal and then as the day has progressed she has got more and more pain in her right arm.  It extends from her volar wrist down to the long and ring fingers.  She also has some pain up into her forearm and at times the pain goes to her shoulder.  No limitation in the elbow or shoulder.  Some increased pain with wrist movement and finger movement.  No limitation in her neck or pain in her neck.  No past history of neck problems or cervical disease.  Past Medical History:  Diagnosis Date  . Abnormal uterine bleeding (AUB) 11/28/2014  . Breast lump on right side at 2 o'clock position 09/06/2017   Formatting of this note might be different from the original. 10/2014 diagnositc mammogram was benign but recommended breast MRI if symptoms persist.  . Fibroids 12/13/2019  . History of complex endometrial hyperplasia without atypia 12/13/2019   On D&C in 2017  . Morbid obesity [facility])     Patient Active Problem List   Diagnosis Date Noted  . History of complex endometrial hyperplasia without atypia 12/13/2019  . Fibroids 12/13/2019  . Breast lump on right side at 2 o'clock position 09/06/2017  . Abnormal uterine bleeding (AUB) 11/28/2014    Past Surgical History:  Procedure Laterality Date  . BREAST LUMPECTOMY Right   . CHOLECYSTECTOMY    . DILATION AND CURETTAGE, DIAGNOSTIC / THERAPEUTIC      OB History    Gravida  3   Para  3   Term  2   Preterm  1   AB      Living  2     SAB      TAB      Ectopic      Multiple      Live Births  3            Home Medications    Prior to Admission  medications   Medication Sig Start Date End Date Taking? Authorizing Provider  ibuprofen (ADVIL) 800 MG tablet 800 mg 05/22/17  Yes [provider]  loratadine (CLARITIN) 10 MG tablet Take by mouth. 06/09/17  Yes [provider]  megestrol (MEGACE) 40 MG tablet Take 1 tablet (40 mg total) by mouth 2 (two) times daily. Can increase to two tablets twice a day in the event of heavy bleeding 02/20/20  Yes Constant, Peggy, MD  HYDROcodone-acetaminophen (NORCO/VICODIN) 5-325 MG tablet Take 1-2 tablets by mouth every 6 (six) hours as needed. 02/25/20   Raylene Everts, MD  methylPREDNISolone (MEDROL DOSEPAK) 4 MG TBPK tablet tad 02/25/20   Raylene Everts, MD  pantoprazole (PROTONIX) 40 MG tablet TAKE 1 TABLET BY MOUTH IN THE MORNING BEFORE BREAKFAST FOR STOMACH PAIN AND ACID 01/15/19   [provider]  norethindrone (AYGESTIN) 5 MG tablet Take 2 tablets (10 mg total) by mouth daily. 12/13/19 02/25/20  Osborne Oman, MD    Family History Family History  Problem Relation Age of Onset  . Breast  cancer Mother   . Thyroid nodules Mother   . Brain cancer Mother   . Hyperlipidemia Father   . Hypertension Father     Social History Social History   Tobacco Use  . Smoking status: Current Some Day Smoker    Types: Cigars  . Smokeless tobacco: Never Used  . Tobacco comment: reports smoking every other day, in process of quitting  Vaping Use  . Vaping Use: Never used  Substance Use Topics  . Alcohol use: Yes    Comment: occasionally  . Drug use: Yes    Types: Marijuana     Allergies   Patient has no known allergies.   Review of Systems Review of Systems  See HPI Physical Exam Triage Vital Signs ED Triage Vitals  Enc Vitals Group     BP 02/25/20 1731 129/74     Pulse Rate 02/25/20 1731 95     Resp 02/25/20 1731 16     Temp 02/25/20 1731 99.6 F (37.6 C)     Temp Source 02/25/20 1731 Oral     SpO2 02/25/20 1731 97 %     Weight --      Height --       Head Circumference --      Peak Flow --      Pain Score 02/25/20 1728 7     Pain Loc --      Pain Edu? --      Excl. in [place]? --    No data found.  Updated Vital Signs BP 129/74 (BP Location: Left Arm)   Pulse 95   Temp 99.6 F (37.6 C) (Oral)   Resp 16   SpO2 97%      Physical Exam Constitutional:      General: She is not in acute distress.    Appearance: She is well-developed.     Comments: Cradles right arm close to body  HENT:     Head: Normocephalic and atraumatic.     Mouth/Throat:     Comments: Mask is in place Eyes:     Conjunctiva/sclera: Conjunctivae normal.     Pupils: Pupils are equal, round, and reactive to light.  Cardiovascular:     Rate and Rhythm: Normal rate.  Pulmonary:     Effort: Pulmonary effort is normal. No respiratory distress.  Musculoskeletal:        General: Normal range of motion.     Cervical back: Normal range of motion. No tenderness.     Comments: Neck is nontender with full range of motion.  Spurling's negative.  Shoulder and elbow have full range of motion with no tenderness.  There is tenderness to palpation of the volar aspect of the forearm near the carpal region, with no visible swelling.  Some crepitus is palpable.  Pain with flexion and extension of the fingers, particularly long and ring.  No sensory deficit.  Phalen's and Tinel's are negative.  Finkelstein's negative.  Skin:    General: Skin is warm and dry.  Neurological:     General: No focal deficit present.     Mental Status: She is alert.     Sensory: No sensory deficit.     Motor: No weakness.     Deep Tendon Reflexes: Reflexes normal.  Psychiatric:        Mood and Affect: Mood normal.        Behavior: Behavior normal.      UC Treatments / Results  Labs (all labs ordered are listed, but  only abnormal results are displayed) Labs Reviewed - No data to display  EKG   Radiology No results found.  Procedures Procedures (including critical care  time)  Medications Ordered in UC Medications - No data to display  Initial Impression / Assessment and Plan / UC Course  I have reviewed the triage vital signs and the nursing notes.  Pertinent labs & imaging results that were available during my care of the patient were reviewed by me and considered in my medical decision making (see chart for details).     Her physical exam findings are most consistent with a tenosynovitis of the wrist.  She does not recall any activity that might have caused this.  I do not understand the pain that shoots up to her shoulder.  She is cradling her arm close to her body and it may be from position.  I considered cervical radiculopathy but she does not have symptoms to support this.  Will treat with prednisone rest and ice.  Return if not improved, or consult orthopedist Final Clinical Impressions(s) / UC Diagnoses   Final diagnoses:  Right arm pain     Discharge Instructions     You need to take the Medrol Dosepak as directed.  This is a steroid anti-inflammatory medicine.  Take all of day 1 today (3 now and 3 at bedtime) Take hydrocodone as needed for pain.  Do not drive on hydrocodone. Rest for the next day or 2. Return if you do not see improvement by the end of the week    ED Prescriptions    Medication Sig Dispense Auth. Provider   methylPREDNISolone (MEDROL DOSEPAK) 4 MG TBPK tablet tad 21 tablet Raylene Everts, MD   HYDROcodone-acetaminophen (NORCO/VICODIN) 5-325 MG tablet Take 1-2 tablets by mouth every 6 (six) hours as needed. 10 tablet Raylene Everts, MD     I have reviewed the PDMP during this encounter.   Raylene Everts, MD 02/26/20 [phone]

## 2020-04-22 ENCOUNTER — Encounter (HOSPITAL_COMMUNITY): Payer: Self-pay | Admitting: Emergency Medicine

## 2020-04-22 ENCOUNTER — Other Ambulatory Visit: Payer: Self-pay

## 2020-04-22 ENCOUNTER — Ambulatory Visit (HOSPITAL_COMMUNITY)
Admission: EM | Admit: 2020-04-22 | Discharge: 2020-04-22 | Disposition: A | Discharge status: Home / Self Care | Payer: Medicaid Other | Attending: Family Medicine | Admitting: Family Medicine

## 2020-04-22 DIAGNOSIS — H5500 Unspecified nystagmus: Secondary | ICD-10-CM | POA: Diagnosis not present

## 2020-04-22 DIAGNOSIS — F17210 Nicotine dependence, cigarettes, uncomplicated: Secondary | ICD-10-CM | POA: Diagnosis not present

## 2020-04-22 DIAGNOSIS — R110 Nausea: Secondary | ICD-10-CM | POA: Diagnosis not present

## 2020-04-22 DIAGNOSIS — Z20822 Contact with and (suspected) exposure to covid-19: Secondary | ICD-10-CM | POA: Diagnosis not present

## 2020-04-22 DIAGNOSIS — R42 Dizziness and giddiness: Secondary | ICD-10-CM | POA: Insufficient documentation

## 2020-04-22 LAB — SARS CORONAVIRUS 2 (TAT 6-24 HRS): SARS Coronavirus 2: NEGATIVE

## 2020-04-22 MED ORDER — ONDANSETRON 4 MG PO TBDP
4.0000 mg | ORAL_TABLET | Freq: Three times a day (TID) | ORAL | 0 refills | Status: AC | PRN
Start: 2020-04-22 — End: ?

## 2020-04-22 MED ORDER — ONDANSETRON 4 MG PO TBDP
4.0000 mg | ORAL_TABLET | Freq: Once | ORAL | Status: AC
  Administered 2020-04-22: 4 mg via ORAL

## 2020-04-22 MED ORDER — ONDANSETRON 4 MG PO TBDP
ORAL_TABLET | ORAL | Status: AC
  Filled 2020-04-22: qty 1

## 2020-04-22 MED ORDER — MECLIZINE HCL 25 MG PO TABS: 25.0000 mg | ORAL_TABLET | Freq: Three times a day (TID) | ORAL | 0 refills | Status: AC | PRN

## 2020-04-22 NOTE — ED Triage Notes (Signed)
Patient c/o nausea and dizziness since last Thursday.   Patient denies any episodes of vomiting.   Patient states dizziness happens at random times. Patient states "it feels like the room is spinning".

## 2020-04-22 NOTE — Discharge Instructions (Addendum)
Checking you for Covid be sure. I believe this may be vertigo. Zofran given here for nausea. Prescription sent to pharmacy for meclizine and Zofran as needed Patient is resting to drink plenty of fluids. Please go to the ER for any worsening symptoms

## 2020-04-23 NOTE — ED Provider Notes (Signed)
[place]    CSN: [id number] Arrival date & time: 04/22/20  1232      History   Chief Complaint No chief complaint on file.   HPI Desiree Butler is a 44 y.o. female.   Patient is a 44 year old female presents today with nausea, dizziness since last Thursday.  This is been intermittent.  Reporting the dizziness has subsided but still having significant nausea.  Dizziness described as the room spinning from time to time.  Denies any loss of consciousness.  Denies any headache, blurred vision, vomiting.  No nasal congestion, rhinorrhea.     Past Medical History:  Diagnosis Date  . Abnormal uterine bleeding (AUB) 11/28/2014  . Breast lump on right side at 2 o'clock position 09/06/2017   Formatting of this note might be different from the original. 10/2014 diagnositc mammogram was benign but recommended breast MRI if symptoms persist.  . Fibroids 12/13/2019  . History of complex endometrial hyperplasia without atypia 12/13/2019   On D&C in 2017  . Morbid obesity [facility])     Patient Active Problem List   Diagnosis Date Noted  . History of complex endometrial hyperplasia without atypia 12/13/2019  . Fibroids 12/13/2019  . Breast lump on right side at 2 o'clock position 09/06/2017  . Abnormal uterine bleeding (AUB) 11/28/2014    Past Surgical History:  Procedure Laterality Date  . BREAST LUMPECTOMY Right   . CHOLECYSTECTOMY    . DILATION AND CURETTAGE, DIAGNOSTIC / THERAPEUTIC      OB History    Gravida  3   Para  3   Term  2   Preterm  1   AB      Living  2     SAB      TAB      Ectopic      Multiple      Live Births  3            Home Medications    Prior to Admission medications   Medication Sig Start Date End Date Taking? Authorizing Provider  megestrol (MEGACE) 40 MG tablet Take 1 tablet (40 mg total) by mouth 2 (two) times daily. Can increase to two tablets twice a day in the event of heavy bleeding 02/20/20  Yes Constant, Peggy,  MD  HYDROcodone-acetaminophen (NORCO/VICODIN) 5-325 MG tablet Take 1-2 tablets by mouth every 6 (six) hours as needed. 02/25/20   Raylene Everts, MD  ibuprofen (ADVIL) 800 MG tablet 800 mg 05/22/17   [provider]  loratadine (CLARITIN) 10 MG tablet Take by mouth. 06/09/17   [provider]  meclizine (ANTIVERT) 25 MG tablet Take 1 tablet (25 mg total) by mouth 3 (three) times daily as needed for dizziness. 04/22/20   Orvan July, NP  methylPREDNISolone (MEDROL DOSEPAK) 4 MG TBPK tablet tad 02/25/20   Raylene Everts, MD  ondansetron (ZOFRAN ODT) 4 MG disintegrating tablet Take 1 tablet (4 mg total) by mouth every 8 (eight) hours as needed for nausea or vomiting. 04/22/20   Bryleigh Ottaway, Tressia Miners A, NP  pantoprazole (PROTONIX) 40 MG tablet TAKE 1 TABLET BY MOUTH IN THE MORNING BEFORE BREAKFAST FOR STOMACH PAIN AND ACID 01/15/19   [provider]  norethindrone (AYGESTIN) 5 MG tablet Take 2 tablets (10 mg total) by mouth daily. 12/13/19 02/25/20  Osborne Oman, MD    Family History Family History  Problem Relation Age of Onset  . Breast cancer Mother   . Thyroid nodules Mother   .  Brain cancer Mother   . Hyperlipidemia Father   . Hypertension Father     Social History Social History   Tobacco Use  . Smoking status: Current Some Day Smoker    Types: Cigars  . Smokeless tobacco: Never Used  . Tobacco comment: reports smoking every other day, in process of quitting  Vaping Use  . Vaping Use: Never used  Substance Use Topics  . Alcohol use: Yes    Comment: occasionally  . Drug use: Yes    Types: Marijuana     Allergies   Patient has no known allergies.   Review of Systems Review of Systems   Physical Exam Triage Vital Signs ED Triage Vitals  Enc Vitals Group     BP 04/22/20 1351 126/85     Pulse Rate 04/22/20 1351 85     Resp 04/22/20 1351 18     Temp 04/22/20 1351 99.1 F (37.3 C)     Temp Source 04/22/20 1351 Oral     SpO2 04/22/20 1351  97 %     Weight 04/22/20 1349 245 lb (111.1 kg)     Height 04/22/20 1349 5\' 5"  (1.651 m)     Head Circumference --      Peak Flow --      Pain Score 04/22/20 1348 4     Pain Loc --      Pain Edu? --      Excl. in [place]? --    No data found.  Updated Vital Signs BP 126/85 (BP Location: Left Arm)   Pulse 85   Temp 99.1 F (37.3 C) (Oral)   Resp 18   Ht 5\' 5"  (1.651 m)   Wt 245 lb (111.1 kg)   LMP  (LMP Unknown)   SpO2 97%   BMI 40.77 kg/m   Visual Acuity Right Eye Distance:   Left Eye Distance:   Bilateral Distance:    Right Eye Near:   Left Eye Near:    Bilateral Near:     Physical Exam Vitals and nursing note reviewed.  Constitutional:      General: She is not in acute distress.    Appearance: Normal appearance. She is not ill-appearing, toxic-appearing or diaphoretic.  HENT:     Head: Normocephalic.     Right Ear: Tympanic membrane and ear canal normal.     Left Ear: Tympanic membrane and ear canal normal.     Nose: Nose normal.  Eyes:     Extraocular Movements: Extraocular movements intact.     Conjunctiva/sclera: Conjunctivae normal.     Pupils: Pupils are equal, round, and reactive to light.     Comments: Mild nystagmus and dizziness with EOM  Cardiovascular:     Rate and Rhythm: Normal rate and regular rhythm.  Pulmonary:     Effort: Pulmonary effort is normal.     Breath sounds: Normal breath sounds.  Musculoskeletal:        General: Normal range of motion.     Cervical back: Normal range of motion.  Skin:    General: Skin is warm and dry.     Findings: No rash.  Neurological:     General: No focal deficit present.     Mental Status: She is alert.  Psychiatric:        Mood and Affect: Mood normal.      UC Treatments / Results  Labs (all labs ordered are listed, but only abnormal results are displayed) Labs Reviewed  SARS CORONAVIRUS 2 (  TAT 6-24 HRS)    EKG   Radiology No results found.  Procedures Procedures (including critical  care time)  Medications Ordered in UC Medications  ondansetron (ZOFRAN-ODT) disintegrating tablet 4 mg (4 mg Oral Given 04/22/20 1443)    Initial Impression / Assessment and Plan / UC Course  I have reviewed the triage vital signs and the nursing notes.  Pertinent labs & imaging results that were available during my care of the patient were reviewed by me and considered in my medical decision making (see chart for details).     Dizziness and nausea Nothing concerning on exam today.  No focal neuro deficits.  Mild nystagmus with EOM and dizziness.  Believe her symptoms are related to vertigo. Zofran given here for nausea Patient's vital signs are otherwise stable and she does not appear to be dehydrated. Recommended increase fluid intake, Zofran as needed, meclizine as needed for dizziness and for any continued or worsening problems she will need to go to the ER. Patient understanding and agree. Final Clinical Impressions(s) / UC Diagnoses   Final diagnoses:  Dizziness  Nausea     Discharge Instructions     Checking you for Covid be sure. I believe this may be vertigo. Zofran given here for nausea. Prescription sent to pharmacy for meclizine and Zofran as needed Patient is resting to drink plenty of fluids. Please go to the ER for any worsening symptoms    ED Prescriptions    Medication Sig Dispense Auth. Provider   meclizine (ANTIVERT) 25 MG tablet Take 1 tablet (25 mg total) by mouth 3 (three) times daily as needed for dizziness. 30 tablet Leverne Amrhein A, NP   ondansetron (ZOFRAN ODT) 4 MG disintegrating tablet Take 1 tablet (4 mg total) by mouth every 8 (eight) hours as needed for nausea or vomiting. 20 tablet Loura Halt A, NP     PDMP not reviewed this encounter.   Orvan July, NP 04/23/20 [phone]

## 2020-06-09 ENCOUNTER — Encounter (HOSPITAL_COMMUNITY): Payer: Self-pay

## 2020-06-09 ENCOUNTER — Ambulatory Visit (HOSPITAL_COMMUNITY)
Admission: EM | Admit: 2020-06-09 | Discharge: 2020-06-09 | Disposition: A | Discharge status: Home / Self Care | Payer: Medicaid Other | Attending: Family Medicine | Admitting: Family Medicine

## 2020-06-09 ENCOUNTER — Other Ambulatory Visit: Payer: Self-pay

## 2020-06-09 DIAGNOSIS — M79602 Pain in left arm: Secondary | ICD-10-CM | POA: Diagnosis not present

## 2020-06-09 DIAGNOSIS — R079 Chest pain, unspecified: Secondary | ICD-10-CM | POA: Diagnosis not present

## 2020-06-09 MED ORDER — CYCLOBENZAPRINE HCL 5 MG PO TABS: 5.0000 mg | ORAL_TABLET | Freq: Three times a day (TID) | ORAL | 0 refills | Status: AC | PRN

## 2020-06-09 MED ORDER — TRIAMCINOLONE ACETONIDE 40 MG/ML IJ SUSP
INTRAMUSCULAR | Status: AC
  Filled 2020-06-09: qty 1

## 2020-06-09 MED ORDER — TRIAMCINOLONE ACETONIDE 40 MG/ML IJ SUSP
40.0000 mg | Freq: Once | INTRAMUSCULAR | Status: AC
  Administered 2020-06-09: 40 mg via INTRAMUSCULAR

## 2020-06-09 NOTE — ED Triage Notes (Signed)
Pt in with c/o central chest pain that started today. Also c/o numbness in left arm and /tingling in left fingers  States she took ibuprofen with some relief  Denies radiation to back or jaw, nausea, vomiting, light headedness

## 2020-06-09 NOTE — ED Notes (Addendum)
EKG complete, shown to Dr. Meda Coffee, MD Dr. Meda Coffee into triage to assess pt status. Pt cleared to be seen at [facility]

## 2020-06-09 NOTE — ED Notes (Signed)
Pt called in lobby, no response 

## 2020-06-10 NOTE — ED Provider Notes (Signed)
[place]    CSN: [id number] Arrival date & time: 06/09/20  1504      History   Chief Complaint Chief Complaint  Patient presents with   Chest Pain   Tingling    HPI Desiree Butler is a 44 y.o. female.   Patient presenting today with 1 day history of central chest pain and pain, numbness and tingling in left arm down into fingers. She denies SOB, palpitations, diaphoresis, N/V, dizziness, syncope. States the chest pain does get some worse with deep breaths. Took some ibuprofen this morning which did seem to help with the pain in both areas but still having some mild numbness. Works at [employer], lifts heavy packages all day. Does have a history of overuse pains in arms per chart review but typically not on the left side. Denies cardiac issues, does have a fhx of heart disease in maternal grandfather per patient. She is a non-smoker.      Past Medical History:  Diagnosis Date   Abnormal uterine bleeding (AUB) 11/28/2014   Breast lump on right side at 2 o'clock position 09/06/2017   Formatting of this note might be different from the original. 10/2014 diagnositc mammogram was benign but recommended breast MRI if symptoms persist.   Fibroids 12/13/2019   History of complex endometrial hyperplasia without atypia 12/13/2019   On D&C in 2017   Morbid obesity [facility])     Patient Active Problem List   Diagnosis Date Noted   History of complex endometrial hyperplasia without atypia 12/13/2019   Fibroids 12/13/2019   Breast lump on right side at 2 o'clock position 09/06/2017   Abnormal uterine bleeding (AUB) 11/28/2014    Past Surgical History:  Procedure Laterality Date   BREAST LUMPECTOMY Right    CHOLECYSTECTOMY     DILATION AND CURETTAGE, DIAGNOSTIC / THERAPEUTIC      OB History    Gravida  3   Para  3   Term  2   Preterm  1   AB      Living  2     SAB      IAB      Ectopic      Multiple      Live Births  3            Home  Medications    Prior to Admission medications   Medication Sig Start Date End Date Taking? Authorizing Provider  cyclobenzaprine (FLEXERIL) 5 MG tablet Take 1 tablet (5 mg total) by mouth 3 (three) times daily as needed for muscle spasms. DO NOT DRINK ALCOHOL OR DRIVE WHILE TAKING THIS MEDICATION 06/09/20   Volney American, PA-C  HYDROcodone-acetaminophen (NORCO/VICODIN) 5-325 MG tablet Take 1-2 tablets by mouth every 6 (six) hours as needed. 02/25/20   Raylene Everts, MD  ibuprofen (ADVIL) 800 MG tablet 800 mg 05/22/17   [provider]  loratadine (CLARITIN) 10 MG tablet Take by mouth. 06/09/17   [provider]  meclizine (ANTIVERT) 25 MG tablet Take 1 tablet (25 mg total) by mouth 3 (three) times daily as needed for dizziness. 04/22/20   Loura Halt A, NP  megestrol (MEGACE) 40 MG tablet Take 1 tablet (40 mg total) by mouth 2 (two) times daily. Can increase to two tablets twice a day in the event of heavy bleeding 02/20/20   Constant, Peggy, MD  methylPREDNISolone (MEDROL DOSEPAK) 4 MG TBPK tablet tad 02/25/20   Raylene Everts, MD  ondansetron (ZOFRAN ODT) 4 MG disintegrating  tablet Take 1 tablet (4 mg total) by mouth every 8 (eight) hours as needed for nausea or vomiting. 04/22/20   Bast, Tressia Miners A, NP  pantoprazole (PROTONIX) 40 MG tablet TAKE 1 TABLET BY MOUTH IN THE MORNING BEFORE BREAKFAST FOR STOMACH PAIN AND ACID 01/15/19   [provider]  norethindrone (AYGESTIN) 5 MG tablet Take 2 tablets (10 mg total) by mouth daily. 12/13/19 02/25/20  Osborne Oman, MD    Family History Family History  Problem Relation Age of Onset   Breast cancer Mother    Thyroid nodules Mother    Brain cancer Mother    Hyperlipidemia Father    Hypertension Father     Social History Social History   Tobacco Use   Smoking status: Current Some Day Smoker    Types: Cigars   Smokeless tobacco: Never Used   Tobacco comment: reports smoking every other day, in  process of quitting  Vaping Use   Vaping Use: Never used  Substance Use Topics   Alcohol use: Yes    Comment: occasionally   Drug use: Yes    Types: Marijuana     Allergies   Patient has no known allergies.   Review of Systems Review of Systems PER HPI   Physical Exam Triage Vital Signs ED Triage Vitals  Enc Vitals Group     BP 06/09/20 1515 (!) 143/91     Pulse Rate 06/09/20 1515 84     Resp 06/09/20 1515 19     Temp 06/09/20 1533 97.7 F (36.5 C)     Temp Source 06/09/20 1533 Oral     SpO2 06/09/20 1515 100 %     Weight --      Height --      Head Circumference --      Peak Flow --      Pain Score 06/09/20 1516 7     Pain Loc --      Pain Edu? --      Excl. in [place]? --    No data found.  Updated Vital Signs BP (!) 143/91 (BP Location: Right Arm)    Pulse 84    Temp 97.7 F (36.5 C) (Oral)    Resp 19    SpO2 100%   Visual Acuity Right Eye Distance:   Left Eye Distance:   Bilateral Distance:    Right Eye Near:   Left Eye Near:    Bilateral Near:     Physical Exam Vitals and nursing note reviewed.  Constitutional:      Appearance: Normal appearance. She is not ill-appearing.  HENT:     Head: Atraumatic.     Mouth/Throat:     Mouth: Mucous membranes are moist.     Pharynx: Oropharynx is clear.  Eyes:     Extraocular Movements: Extraocular movements intact.     Conjunctiva/sclera: Conjunctivae normal.  Cardiovascular:     Rate and Rhythm: Normal rate and regular rhythm.     Heart sounds: Normal heart sounds.  Pulmonary:     Effort: Pulmonary effort is normal.     Breath sounds: Normal breath sounds. No wheezing or rales.  Abdominal:     General: Bowel sounds are normal. There is no distension.     Palpations: Abdomen is soft.     Tenderness: There is no abdominal tenderness. There is no guarding.  Musculoskeletal:        General: No swelling, tenderness, deformity or signs of injury. Normal range of motion.  Cervical back: Normal range  of motion and neck supple.  Skin:    General: Skin is warm and dry.  Neurological:     Mental Status: She is alert and oriented to person, place, and time.     Cranial Nerves: No cranial nerve deficit.     Sensory: No sensory deficit.     Motor: No weakness.     Gait: Gait normal.  Psychiatric:        Mood and Affect: Mood normal.        Thought Content: Thought content normal.        Judgment: Judgment normal.      UC Treatments / Results  Labs (all labs ordered are listed, but only abnormal results are displayed) Labs Reviewed - No data to display  EKG   Radiology No results found.  Procedures Procedures (including critical care time)  Medications Ordered in UC Medications  triamcinolone acetonide (KENALOG-40) injection 40 mg (40 mg Intramuscular Given 06/09/20 1739)    Initial Impression / Assessment and Plan / UC Course  I have reviewed the triage vital signs and the nursing notes.  Pertinent labs & imaging results that were available during my care of the patient were reviewed by me and considered in my medical decision making (see chart for details).     Patient's distribution of pain is fairly consistent with pectoral strain, radicular pain down left arm but concern for lack of reproduction of pain on exam today even with ROM against resistance exercises. Her EKG showed some inferior lead T wave changes, NSR but no past EKGs available for comparison. She is overall well appearing, vitals WNL aside from mild HTN. Discussed my concerns with patient regarding inability to r/o cardiac event in this setting and my recommendation to go to ED for further evaluation. She is opting to treat for strain/radiculopathy and rest at home, returning to ED immediately if not improving or worsening at any time. IM kenalog given in clinic, flexeril, NSAIDs prn at home. Work note given. Again reiterated ED precautions and recommended she go for further r/o of cardiac causes of CP.    Final Clinical Impressions(s) / UC Diagnoses   Final diagnoses:  Chest pain, unspecified type  Left arm pain   Discharge Instructions   None    ED Prescriptions    Medication Sig Dispense Auth. Provider   cyclobenzaprine (FLEXERIL) 5 MG tablet Take 1 tablet (5 mg total) by mouth 3 (three) times daily as needed for muscle spasms. DO NOT DRINK ALCOHOL OR DRIVE WHILE TAKING THIS MEDICATION 15 tablet Volney American, [place]     PDMP not reviewed this encounter.   Volney American, [place] 06/10/20 1130

## 2020-08-13 ENCOUNTER — Other Ambulatory Visit: Payer: Self-pay | Admitting: Obstetrics and Gynecology

## 2020-09-17 ENCOUNTER — Other Ambulatory Visit: Payer: Self-pay

## 2020-09-17 ENCOUNTER — Encounter: Payer: Self-pay | Admitting: Obstetrics & Gynecology

## 2020-09-17 ENCOUNTER — Ambulatory Visit (INDEPENDENT_AMBULATORY_CARE_PROVIDER_SITE_OTHER): Payer: Medicaid Other | Admitting: Obstetrics & Gynecology

## 2020-09-17 VITALS — BP 129/81 | HR 79 | Wt 220.4 lb

## 2020-09-17 DIAGNOSIS — N939 Abnormal uterine and vaginal bleeding, unspecified: Secondary | ICD-10-CM | POA: Diagnosis not present

## 2020-09-17 DIAGNOSIS — Z8742 Personal history of other diseases of the female genital tract: Secondary | ICD-10-CM | POA: Diagnosis not present

## 2020-09-17 NOTE — Progress Notes (Signed)
   GYNECOLOGY OFFICE VISIT NOTE  History:   Desiree Butler is a 45 y.o. [id number] here today for bleeding issues. Currently on Megace 40 mg po bid, but does not take this daily only as needed. History of endometrial hyperplasia in the past, recent ultrasound in 12/2019 showed unremarkable 3 mm endometrial stripe. She denies any abnormal vaginal discharge, pelvic pain or other concerns.    Past Medical History:  Diagnosis Date  . Abnormal uterine bleeding (AUB) 11/28/2014  . Breast lump on right side at 2 o'clock position 09/06/2017   Formatting of this note might be different from the original. 10/2014 diagnositc mammogram was benign but recommended breast MRI if symptoms persist.  . Fibroids 12/13/2019  . History of complex endometrial hyperplasia without atypia 12/13/2019   On D&C in 2017  . Morbid obesity ([place])     Past Surgical History:  Procedure Laterality Date  . BREAST LUMPECTOMY Right   . CHOLECYSTECTOMY    . DILATION AND CURETTAGE, DIAGNOSTIC / THERAPEUTIC      The following portions of the patient's history were reviewed and updated as appropriate: allergies, current medications, past family history, past medical history, past social history, past surgical history and problem list.   Health Maintenance:  Normal pap and negative HRHPV on 01/18/2020.  Normal mammogram on 12/25/2019.   Review of Systems:  Pertinent items noted in HPI and remainder of comprehensive ROS otherwise negative.  Physical Exam:  BP 129/81   Pulse 79   Wt 220 lb 6.4 oz (100 kg)   LMP 09/11/2020 (Exact Date)   BMI 36.68 kg/m  CONSTITUTIONAL: Well-developed, well-nourished female in no acute distress.  MUSCULOSKELETAL: Normal range of motion. No edema noted. NEUROLOGIC: Alert and oriented to person, place, and time. Normal muscle tone coordination. No cranial nerve deficit noted. PSYCHIATRIC: Normal mood and affect. Normal behavior. Normal judgment and thought content. CARDIOVASCULAR: Normal heart rate  noted RESPIRATORY: Effort and breath sounds normal, no problems with respiration noted ABDOMEN: No masses noted. No other overt distention noted.   PELVIC: Deferred    Assessment and Plan:      1. Abnormal uterine bleeding (AUB) 2. History of complex endometrial hyperplasia without atypia Emphasized importance of taking Megace daily as prescribed to prevent AUB and to treat history of hyperplasia.  She verbalized understanding of plan. She was advised to call back for worsening symptoms; bleeding precautions reviewed.  Routine preventative health maintenance measures emphasized. Please refer to After Visit Summary for other counseling recommendations.   Return for any gynecologic concerns.    I spent 10 minutes dedicated to the care of this patient including pre-visit review of records, face to face time with the patient discussing her conditions and treatments and post visit ordering of testing.    Verita Schneiders, MD, [place] for [employer], [place]

## 2021-01-08 ENCOUNTER — Other Ambulatory Visit: Payer: Self-pay | Admitting: Medical

## 2021-01-22 ENCOUNTER — Other Ambulatory Visit: Payer: Self-pay | Admitting: Lactation Services

## 2021-01-22 MED ORDER — MEGESTROL ACETATE 40 MG PO TABS: 40.0000 mg | ORAL_TABLET | Freq: Two times a day (BID) | ORAL | 1 refills | Status: AC

## 2021-01-22 NOTE — Progress Notes (Signed)
Reordered Megace per patient request per Kerry Hough, PA. Will route to Dr. Harolyn Rutherford for advisement about future care.

## 2021-05-01 ENCOUNTER — Other Ambulatory Visit: Payer: Self-pay | Admitting: Medical

## 2021-06-04 ENCOUNTER — Other Ambulatory Visit: Payer: Self-pay | Admitting: Lactation Services

## 2021-06-04 MED ORDER — MEGESTROL ACETATE 40 MG PO TABS: 40.0000 mg | ORAL_TABLET | Freq: Two times a day (BID) | ORAL | 3 refills | Status: AC

## 2021-06-04 NOTE — Progress Notes (Signed)
Megace refilled per Dr. Harolyn Rutherford. Called patient to inform her prescription has been refilled and sent to Pharmacy.

## 2022-06-05 IMAGING — MG DIGITAL SCREENING BILAT W/ TOMO W/ CAD
8 of 14 series · 8 of 40 positions shown · non-contrast
Comparison: None.

CLINICAL DATA: Screening.

EXAM:
DIGITAL SCREENING BILATERAL MAMMOGRAM WITH TOMO AND CAD

[L CC synth-2D (1 of 2)]
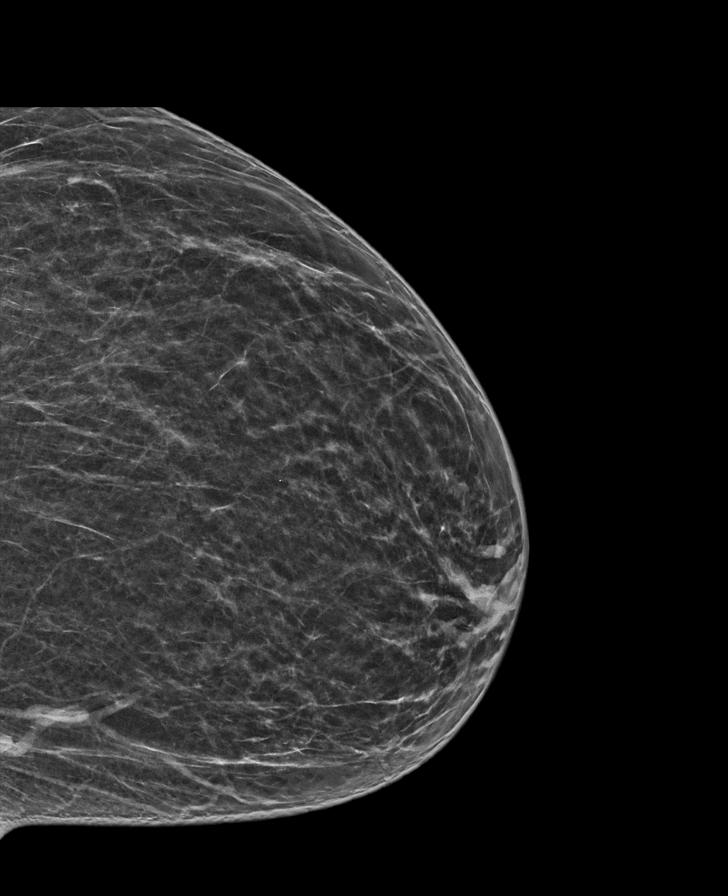

[R MLO synth-2D (1 of 2)]
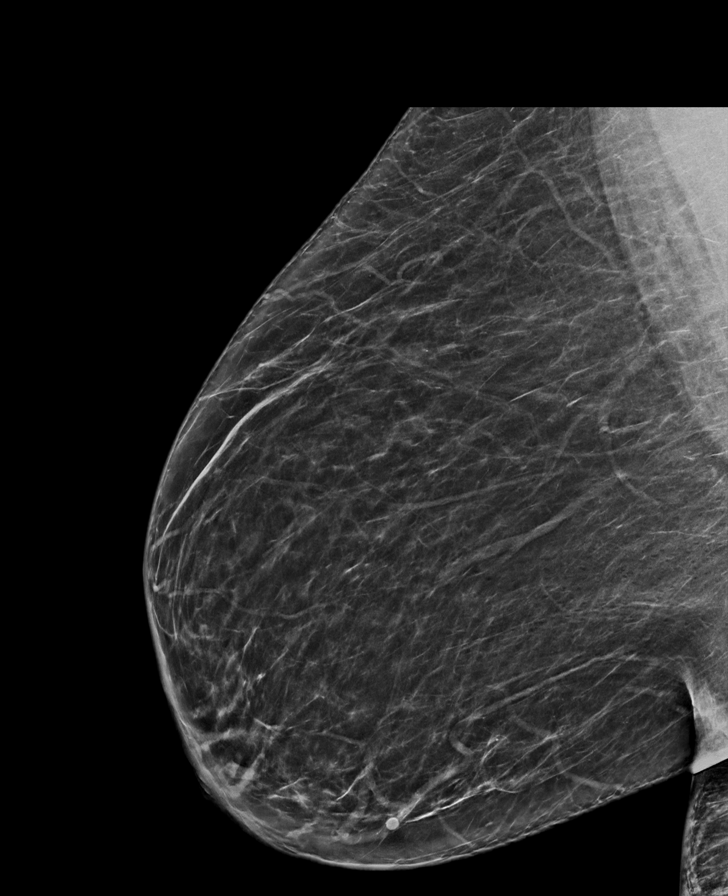

[L MLO synth-2D (1 of 2)]
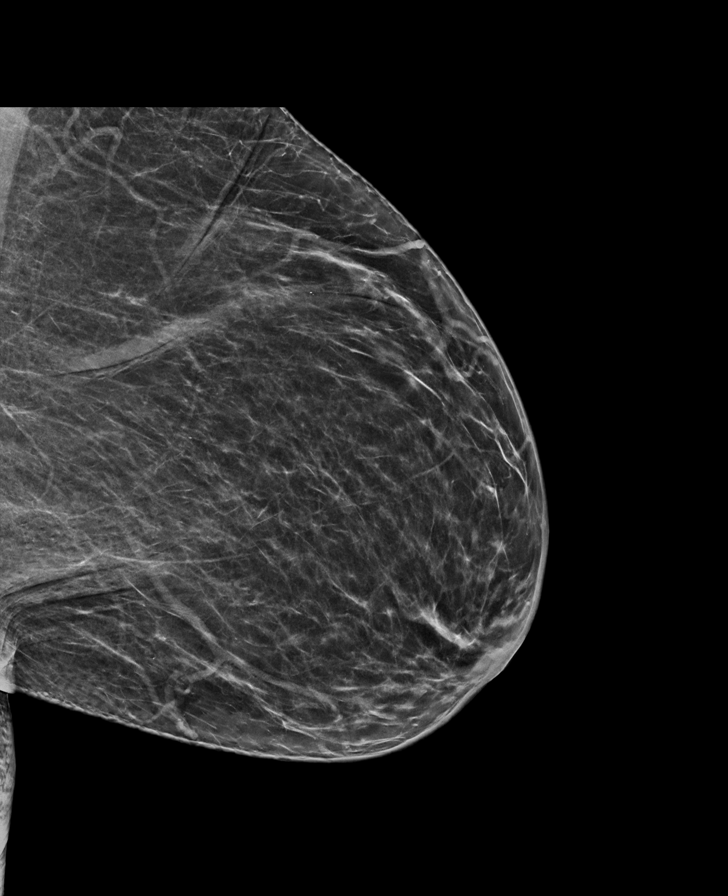

[L CC synth-2D (2 of 2)]
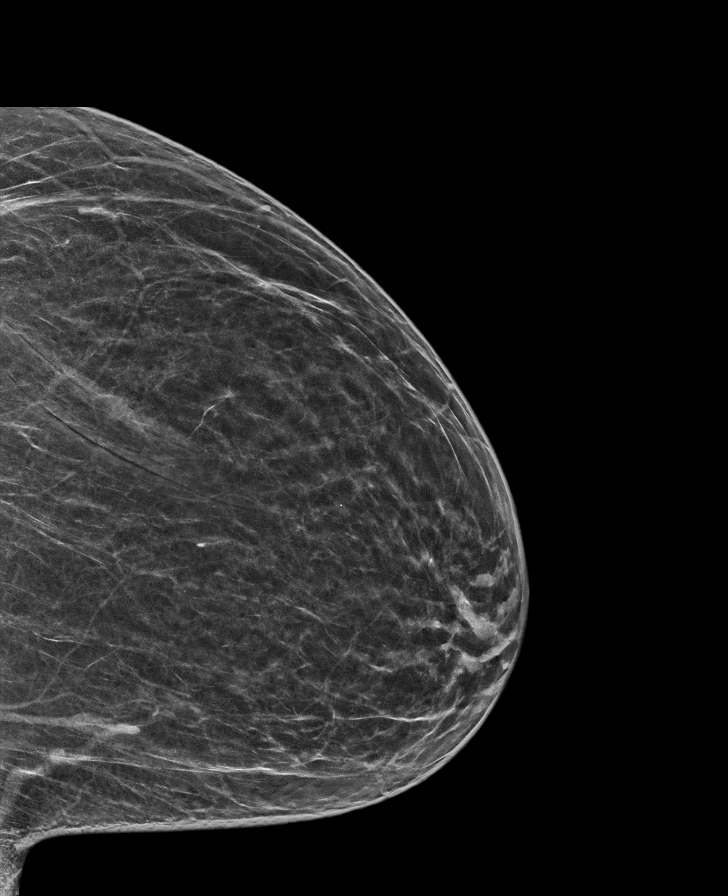

[R MLO synth-2D (2 of 2)]
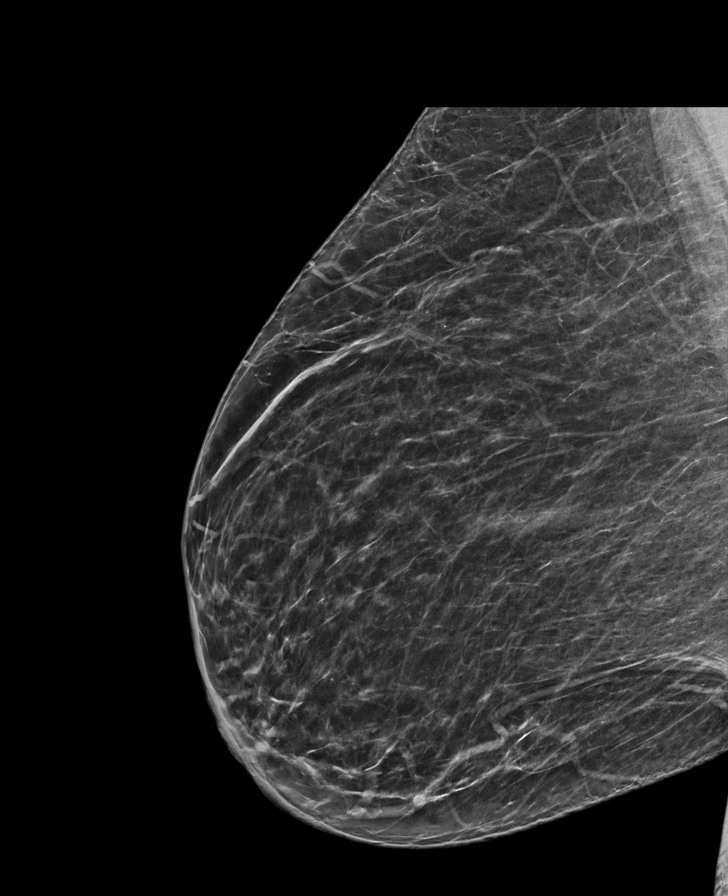

[R CC synth-2D]
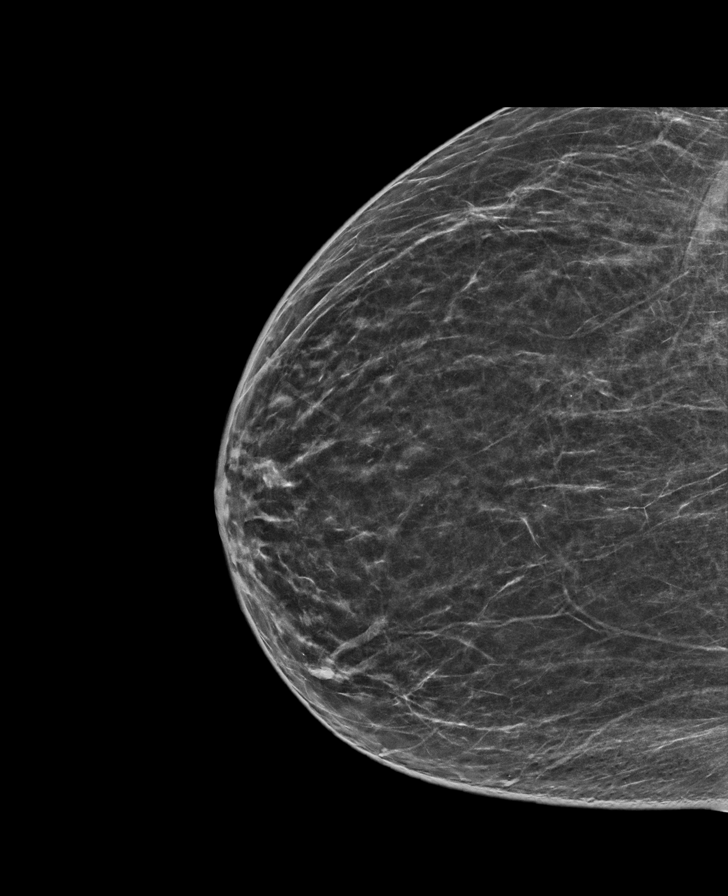

[L MLO synth-2D (2 of 2)]
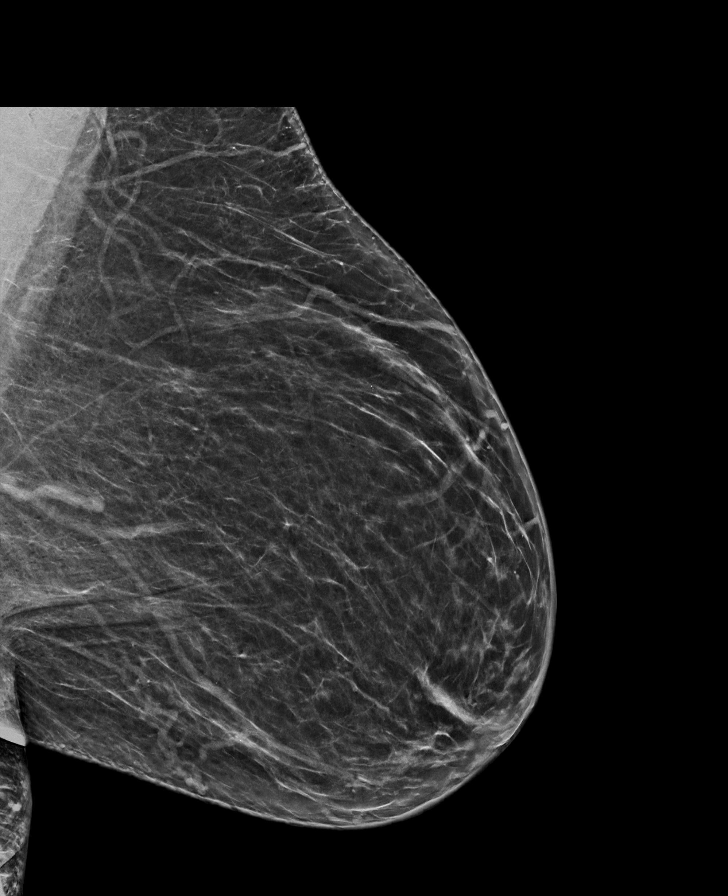

[L MLO tomo · tomo slice 35/70.0]
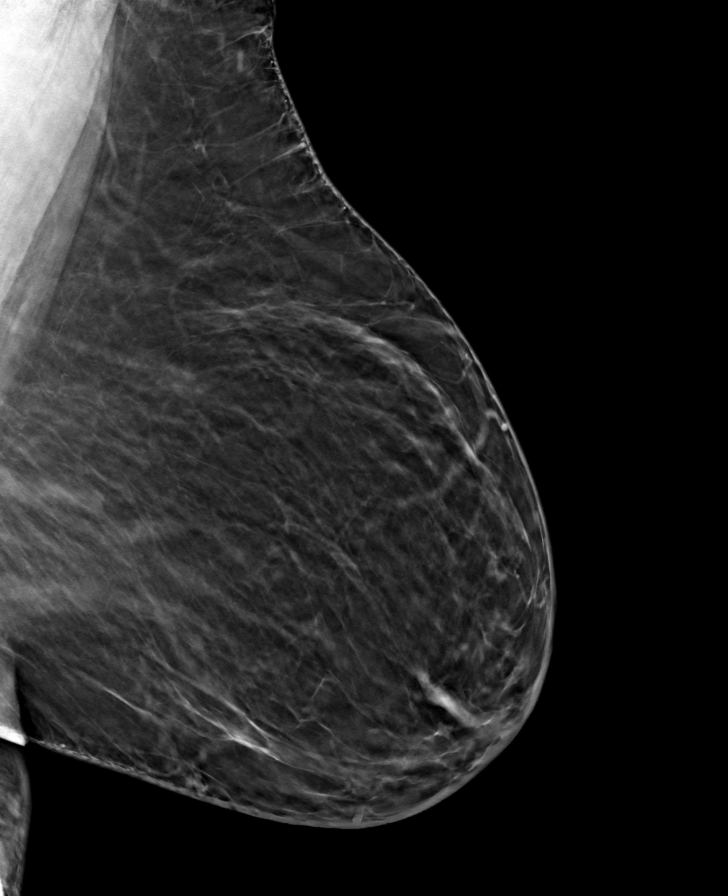

[8 of 40 positions shown; findings below may reference images not displayed]

ACR Breast Density Category b: There are scattered areas of
fibroglandular density.
FINDINGS: There are no findings suspicious for malignancy. Images were
processed with CAD.
IMPRESSION: No mammographic evidence of malignancy. A result letter of this
screening mammogram will be mailed directly to the patient.

RECOMMENDATION:
Screening mammogram in one year. (Code:Y5-G-EJ6)

BI-RADS CATEGORY  1: Negative.

## 2022-06-22 IMAGING — US US PELVIS COMPLETE WITH TRANSVAGINAL
1 series · 14 of 25 positions shown · non-contrast
Comparison: None

CLINICAL DATA: Initial evaluation for history of uterine fibroids,
ovarian cyst. Abnormal uterine bleeding.



[Series 1: us pelvis complete with transvaginal · 14 of 87 slices shown]
[im 1/87]
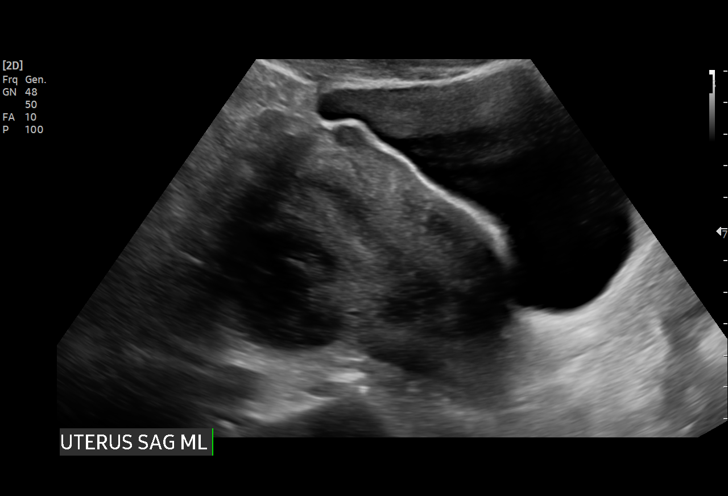
[im 8/87]
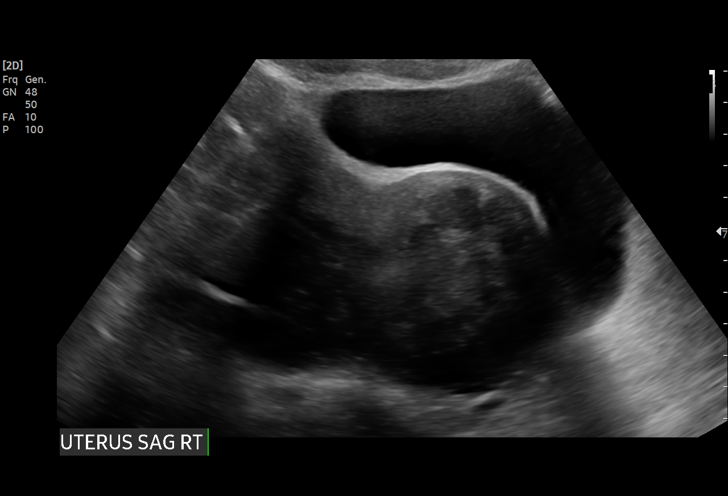
[im 15/87]
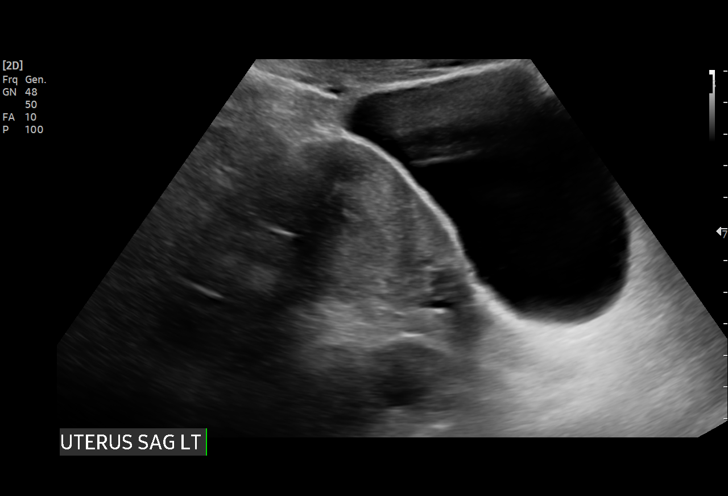
[im 22/87]
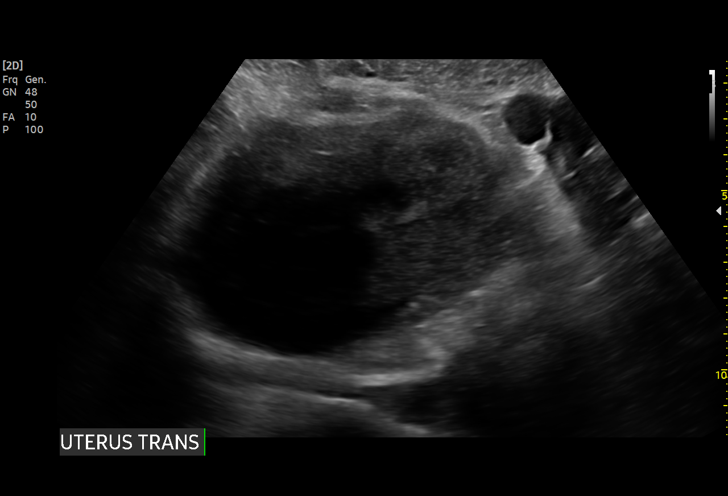
[im 29/87]
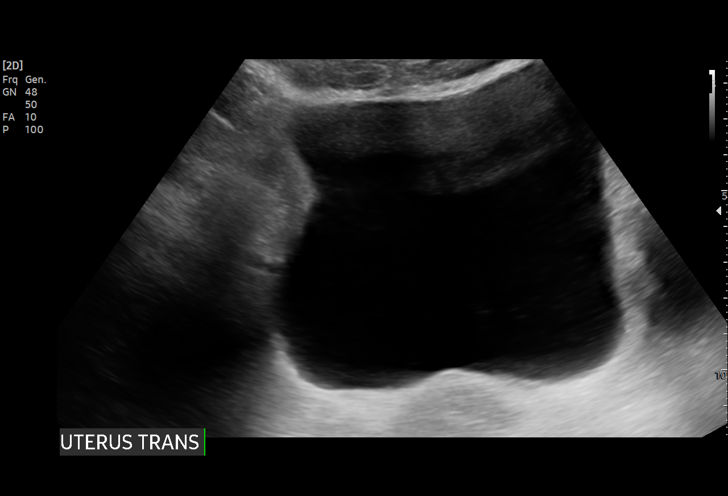
[im 33/87]
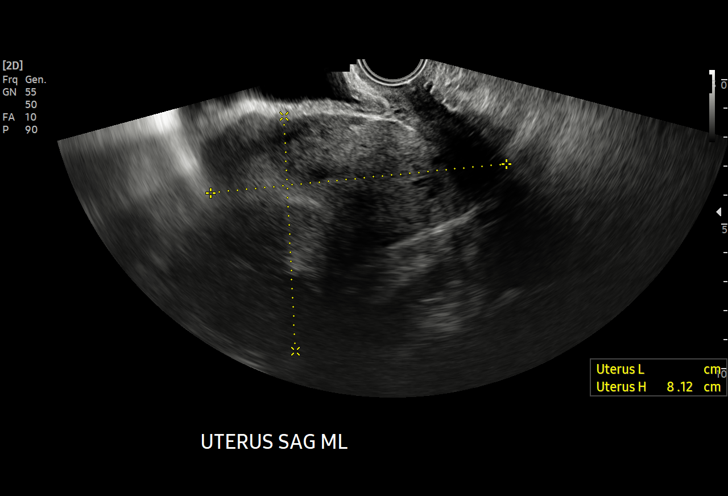
[im 40/87]
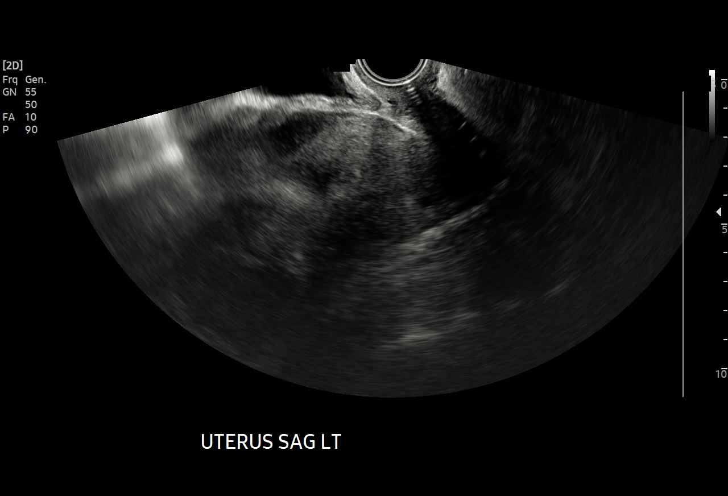
[im 47/87]
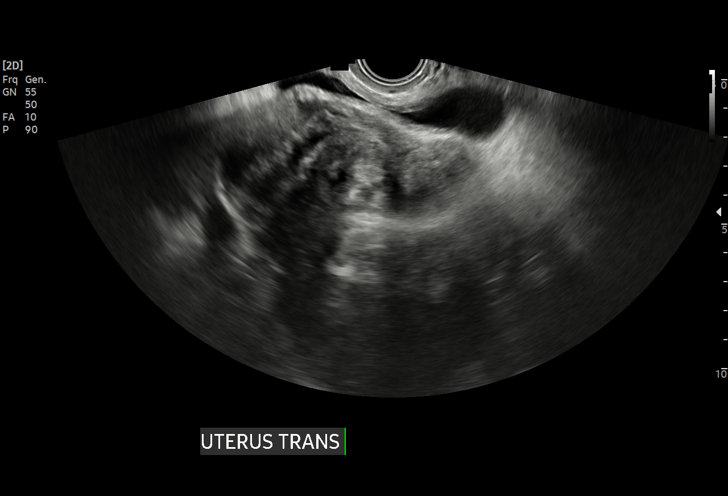
[im 54/87]
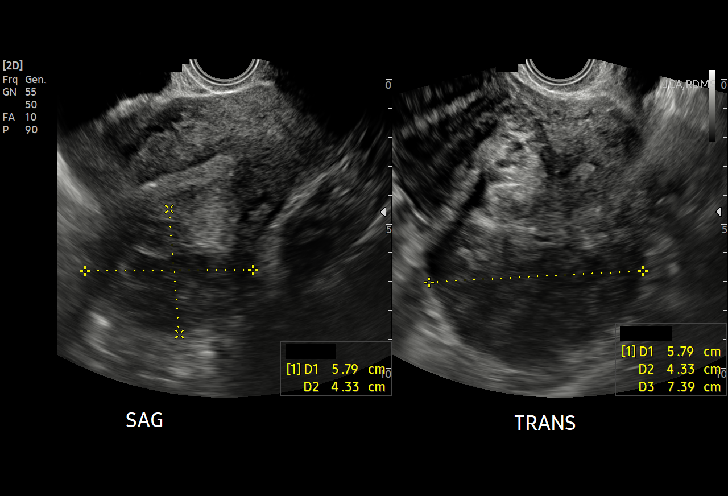
[im 58/87]
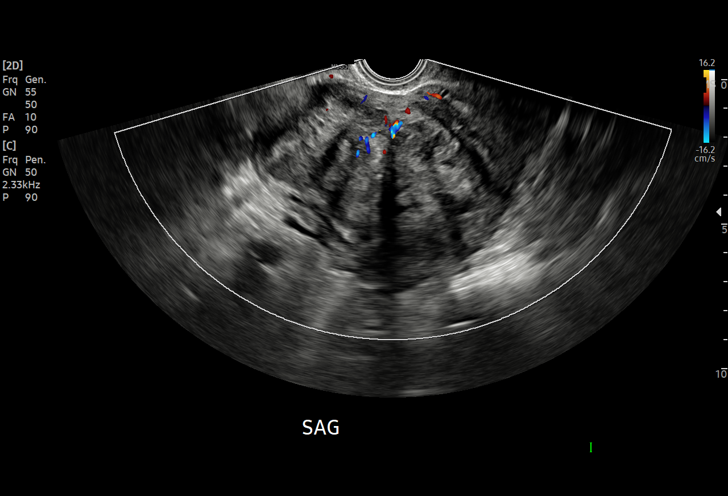
[im 65/87]
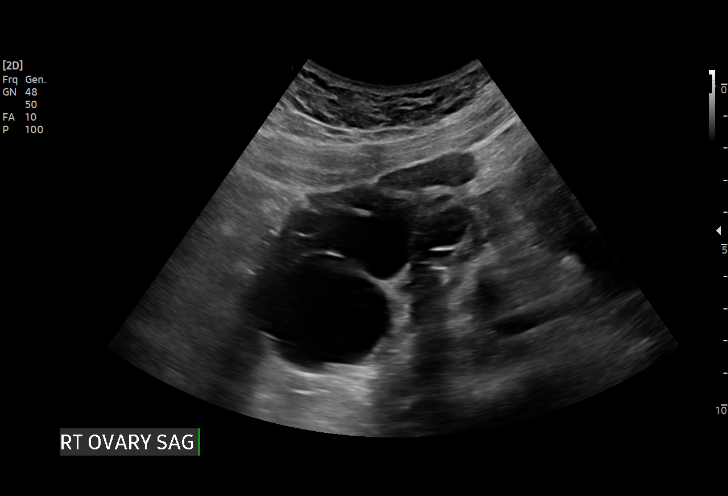
[im 72/87]
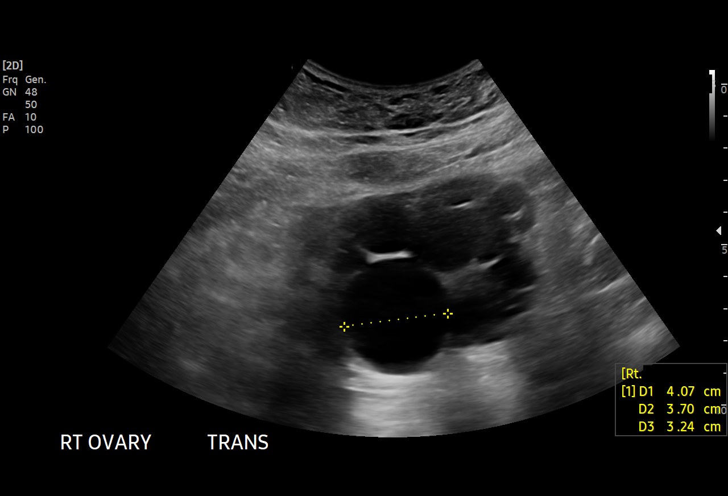
[im 79/87]
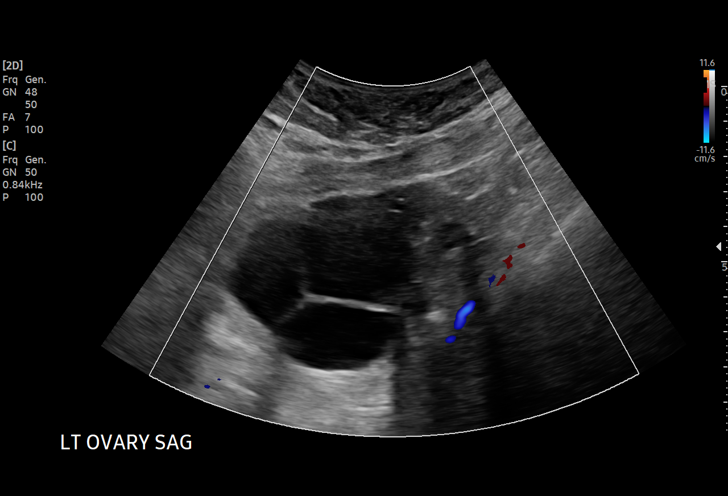
[im 87/87]
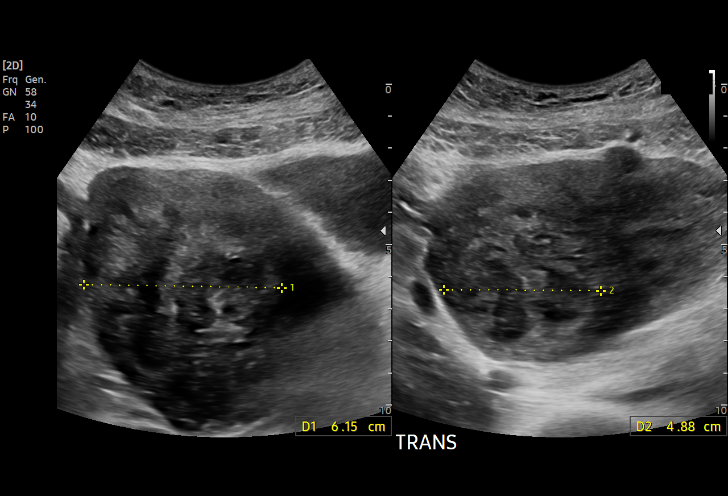

[14 of 25 positions shown; findings below may reference images not displayed]

FINDINGS: Uterus

Measurements: 10.3 x 8.1 x 8.0 cm. Multiple uterine fibroids are
seen, 2 largest of which are measured. 5.8 x 4.3 x 7.4 cm subserosal
fibroid present at the posterior uterine fundus. Additional 8.7 x
6.4 x 7.0 cm subserosal to exophytic fibroid present at the right
anterior uterine fundus.

Endometrium

Thickness: 3.5 mm.  No focal abnormality visualized.

Right ovary

Measurements: 8.9 x 4.9 x 7.7 cm = volume: 177.2 mL. 4.1 x 3.7 x
cm simple cyst noted. Additional 3.0 x 2.8 x 3.0 cm simple cyst
present. No significant internal complexity, vascularity, or solid
component.

Left ovary

Measurements: 8.3 x 5.2 x 6.4 cm = volume: 144 mL. 4.4 x 3.8 x
cm simple cyst. No internal complexity, vascularity, or solid
component.

Other findings

Trace free fluid noted within the pelvis.
IMPRESSION: 1. Enlarged fibroid uterus as detailed above.
2. Multiple simple bilateral ovarian cysts as above, largest of
which measures 4.4 cm on the left. These have benign characteristics
and are a common finding in premenopausal females. No imaging follow
up is required. This follows consensus guidelines: Simple Adnexal
Cysts: SRU Consensus Conference Update on Follow-up and Reporting.
3. Endometrial stripe measures 3.5 mm in thickness. If bleeding
remains unresponsive to hormonal or medical therapy, sonohysterogram
should be considered for focal lesion work-up. (Ref: Radiological
Reasoning: Algorithmic Workup of Abnormal Vaginal Bleeding with
Endovaginal Sonography and Sonohysterography. AJR [id number]; [id number])
